# Patient Record
Sex: Male | Born: 1975 | Race: White | Hispanic: No | Smoking: Former smoker
Health system: Southern US, Community
[De-identification: ages and names within clinical notes are randomized; demographics above are authoritative.]

## PROBLEM LIST (undated history)

## (undated) DIAGNOSIS — I10 Essential (primary) hypertension: Secondary | ICD-10-CM

## (undated) DIAGNOSIS — Z8619 Personal history of other infectious and parasitic diseases: Secondary | ICD-10-CM

## (undated) DIAGNOSIS — T7840XA Allergy, unspecified, initial encounter: Secondary | ICD-10-CM

## (undated) DIAGNOSIS — G4733 Obstructive sleep apnea (adult) (pediatric): Secondary | ICD-10-CM

## (undated) DIAGNOSIS — E119 Type 2 diabetes mellitus without complications: Secondary | ICD-10-CM

## (undated) DIAGNOSIS — K429 Umbilical hernia without obstruction or gangrene: Secondary | ICD-10-CM

## (undated) DIAGNOSIS — I252 Old myocardial infarction: Secondary | ICD-10-CM

## (undated) DIAGNOSIS — K219 Gastro-esophageal reflux disease without esophagitis: Secondary | ICD-10-CM

## (undated) DIAGNOSIS — J45909 Unspecified asthma, uncomplicated: Secondary | ICD-10-CM

## (undated) DIAGNOSIS — E291 Testicular hypofunction: Secondary | ICD-10-CM

## (undated) DIAGNOSIS — E785 Hyperlipidemia, unspecified: Secondary | ICD-10-CM

## (undated) HISTORY — DX: Allergy, unspecified, initial encounter: T78.40XA

## (undated) HISTORY — PX: KNEE SURGERY: SHX244

## (undated) HISTORY — DX: Unspecified asthma, uncomplicated: J45.909

## (undated) HISTORY — PX: BARIATRIC SURGERY: SHX1103

## (undated) HISTORY — DX: Testicular hypofunction: E29.1

## (undated) HISTORY — DX: Gastro-esophageal reflux disease without esophagitis: K21.9

## (undated) HISTORY — DX: Old myocardial infarction: I25.2

## (undated) HISTORY — DX: Umbilical hernia without obstruction or gangrene: K42.9

## (undated) HISTORY — DX: Personal history of other infectious and parasitic diseases: Z86.19

## (undated) HISTORY — DX: Type 2 diabetes mellitus without complications: E11.9

## (undated) HISTORY — DX: Hyperlipidemia, unspecified: E78.5

## (undated) HISTORY — DX: Essential (primary) hypertension: I10

## (undated) HISTORY — PX: HIATAL HERNIA REPAIR: SHX195

## (undated) HISTORY — DX: Obstructive sleep apnea (adult) (pediatric): G47.33

---

## 2012-05-21 DIAGNOSIS — I219 Acute myocardial infarction, unspecified: Secondary | ICD-10-CM

## 2012-05-21 HISTORY — DX: Acute myocardial infarction, unspecified: I21.9

## 2013-05-21 HISTORY — PX: KNEE SURGERY: SHX244

## 2013-07-13 HISTORY — PX: BARIATRIC SURGERY: SHX1103

## 2018-06-24 DIAGNOSIS — G4733 Obstructive sleep apnea (adult) (pediatric): Secondary | ICD-10-CM | POA: Insufficient documentation

## 2018-06-25 DIAGNOSIS — I252 Old myocardial infarction: Secondary | ICD-10-CM | POA: Insufficient documentation

## 2019-03-22 HISTORY — PX: NASAL SEPTUM SURGERY: SHX37

## 2019-12-20 DIAGNOSIS — Z419 Encounter for procedure for purposes other than remedying health state, unspecified: Secondary | ICD-10-CM | POA: Diagnosis not present

## 2020-01-20 DIAGNOSIS — Z419 Encounter for procedure for purposes other than remedying health state, unspecified: Secondary | ICD-10-CM | POA: Diagnosis not present

## 2020-02-05 ENCOUNTER — Encounter: Payer: Self-pay | Admitting: Family Medicine

## 2020-02-05 ENCOUNTER — Ambulatory Visit (INDEPENDENT_AMBULATORY_CARE_PROVIDER_SITE_OTHER): Payer: Medicaid Other | Admitting: Family Medicine

## 2020-02-05 ENCOUNTER — Other Ambulatory Visit: Payer: Self-pay

## 2020-02-05 VITALS — BP 124/71 | HR 69 | Temp 97.9°F | Ht 71.0 in | Wt 301.8 lb

## 2020-02-05 DIAGNOSIS — R197 Diarrhea, unspecified: Secondary | ICD-10-CM | POA: Diagnosis not present

## 2020-02-05 DIAGNOSIS — E1165 Type 2 diabetes mellitus with hyperglycemia: Secondary | ICD-10-CM | POA: Diagnosis not present

## 2020-02-05 DIAGNOSIS — R600 Localized edema: Secondary | ICD-10-CM

## 2020-02-05 DIAGNOSIS — E785 Hyperlipidemia, unspecified: Secondary | ICD-10-CM

## 2020-02-05 NOTE — Progress Notes (Signed)
New Patient Office Visit  Assessment & Plan:  1. Bilateral lower extremity edema - Education provided on edema. Encouraged patient to decrease salt intake and elevate feet when able.  - CBC with Differential/Platelet; Future - CMP14+EGFR; Future - Thyroid Panel With TSH; Future - Brain natriuretic peptide; Future  2. Diarrhea, unspecified type - Cdiff NAA+O+P+Stool Culture - Tissue Transglutaminase Abs,IgG,IgA; Future  3. Hyperlipidemia, unspecified hyperlipidemia type - CMP14+EGFR; Future - Lipid panel; Future  4. Type 2 diabetes mellitus with hyperglycemia, without long-term current use of insulin (HCC) - CMP14+EGFR; Future - Microalbumin / creatinine urine ratio; Future - Bayer DCA Hb A1c Waived; Future  Patient will return next week for fasting labs.   Follow-up: Return for as directed after labs result.   Hendricks Limes, MSN, APRN, FNP-C [facility]  Subjective:  Patient ID: Jimmy Tyler, male    DOB: 30-Sep-1975  Age: 44 y.o. MRN: [id number]  Patient Care Team: Loman Brooklyn, FNP as PCP - General (Family Medicine)  CC:  Chief Complaint  Patient presents with  . New Patient (Initial Visit)    Patient is from [place]   . Establish Care  . Edema    Patient states that his lower legs have been swelling.  . Diarrhea    Patient states this has been ongoing and has gotten worse the last few years.  States he never got answers as to what was going on.    HPI Jimmy Tyler presents to establish care.   Patient reports bilateral lower extremity edema that is always worse at the end of the day. This has been going on for years. He reports he just wants to make sure nothing is wrong as his son rode a toy into his shin a month ago and he noticed it took a long time for the indention to go away. Denies shortness of breath.   Patient also reports he has had diarrhea for 20-25 years but has never had an answer as to why. He reports it has  been worse for the past 6-8 months. He has been back on metformin for about the same amount of time. He is not sure if his prescription is regular or XR. He reports the diarrhea was going on before he ever started on metformin, but he has been on and off it for many years due to his weight. He was able to come off of it after bariatric surgery, but recently had to start it back. He has tried increasing his fiber intake, but that did not make a difference. He is currently taking Imodium-AD 2-3 times/day. He has 2-3 loose stools per day on a normal day and 10 on a bad day. Denies any abdominal pain. He reports he was diagnosed with c.diff a few years ago that was never treated to his knowledge. He has never had a colonoscopy.   Patient reports he has been on Zetia for hyperlipidemia since October or November of 2020. He is intolerant of statins due to body aches.    Review of Systems  Constitutional: Negative for chills, fever, malaise/fatigue and weight loss.  HENT: Negative for congestion, ear discharge, ear pain, nosebleeds, sinus pain, sore throat and tinnitus.   Eyes: Negative for blurred vision, double vision, pain, discharge and redness.  Respiratory: Negative for cough, shortness of breath and wheezing.   Cardiovascular: Positive for leg swelling. Negative for chest pain and palpitations.  Gastrointestinal: Positive for diarrhea. Negative for abdominal pain, constipation, heartburn, nausea  and vomiting.  Genitourinary: Negative for dysuria, frequency and urgency.  Musculoskeletal: Negative for myalgias.  Skin: Negative for rash.  Neurological: Negative for dizziness, seizures, weakness and headaches.  Psychiatric/Behavioral: Negative for depression, substance abuse and suicidal ideas. The patient is not nervous/anxious.    No current outpatient medications on file.  No Known Allergies  Past Medical History:  Diagnosis Date  . Allergy   . Asthma   . Diabetes mellitus without  complication ([place])   . Hyperlipidemia   . Hypertension     Past Surgical History:  Procedure Laterality Date  . BARIATRIC SURGERY    . KNEE SURGERY      Family History  Problem Relation Age of Onset  . Autism Son   . Skin cancer Maternal Grandmother     Social History   Socioeconomic History  . Marital status: Married    Spouse name: Not on file  . Number of children: Not on file  . Years of education: Not on file  . Highest education level: Not on file  Occupational History  . Not on file  Tobacco Use  . Smoking status: Former Smoker    Packs/day: 1.00    Types: Cigarettes    Quit date: 02/04/2014    Years since quitting: 6.0  . Smokeless tobacco: Never Used  Vaping Use  . Vaping Use: Never used  Substance and Sexual Activity  . Alcohol use: Yes    Comment: occ  . Drug use: Never  . Sexual activity: Yes    Birth control/protection: None  Other Topics Concern  . Not on file  Social History Narrative  . Not on file   Social Determinants of Health   Financial Resource Strain:   . Difficulty of Paying Living Expenses: Not on file  Food Insecurity:   . Worried About Charity fundraiser in the Last Year: Not on file  . Ran Out of Food in the Last Year: Not on file  Transportation Needs:   . Lack of Transportation (Medical): Not on file  . Lack of Transportation (Non-Medical): Not on file  Physical Activity:   . Days of Exercise per Week: Not on file  . Minutes of Exercise per Session: Not on file  Stress:   . Feeling of Stress : Not on file  Social Connections:   . Frequency of Communication with Friends and Family: Not on file  . Frequency of Social Gatherings with Friends and Family: Not on file  . Attends Religious Services: Not on file  . Active Member of Clubs or Organizations: Not on file  . Attends Archivist Meetings: Not on file  . Marital Status: Not on file  Intimate Partner Violence:   . Fear of Current or Ex-Partner: Not on file    . Emotionally Abused: Not on file  . Physically Abused: Not on file  . Sexually Abused: Not on file    Objective:   Today's Vitals: BP 124/71   Pulse 69   Temp 97.9 F (36.6 C) (Temporal)   Ht _0  (1.803 m)   Wt (!) 301 lb 12.8 oz (136.9 kg)   SpO2 96%   BMI 42.09 kg/m   Physical Exam Vitals reviewed.  Constitutional:      General: He is not in acute distress.    Appearance: Normal appearance. He is morbidly obese. He is not ill-appearing, toxic-appearing or diaphoretic.  HENT:     Head: Normocephalic and atraumatic.  Eyes:  General: No scleral icterus.       Right eye: No discharge.        Left eye: No discharge.     Conjunctiva/sclera: Conjunctivae normal.  Cardiovascular:     Rate and Rhythm: Normal rate and regular rhythm.     Heart sounds: Normal heart sounds. No murmur heard.  No friction rub. No gallop.   Pulmonary:     Effort: Pulmonary effort is normal. No respiratory distress.     Breath sounds: Normal breath sounds. No stridor. No wheezing, rhonchi or rales.  Musculoskeletal:        General: Normal range of motion.     Cervical back: Normal range of motion.     Right lower leg: Edema (2+) present.     Left lower leg: Edema (2+) present.  Skin:    General: Skin is warm and dry.  Neurological:     Mental Status: He is alert and oriented to person, place, and time. Mental status is at baseline.  Psychiatric:        Mood and Affect: Mood normal.        Behavior: Behavior normal.        Thought Content: Thought content normal.        Judgment: Judgment normal.

## 2020-02-05 NOTE — Patient Instructions (Signed)
Please call back ASAP with your medication dosages so we can put it in your chart.   Edema  Edema is when you have too much fluid in your body or under your skin. Edema may make your legs, feet, and ankles swell up. Swelling is also common in looser tissues, like around your eyes. This is a common condition. It gets more common as you get older. There are many possible causes of edema. Eating too much salt (sodium) and being on your feet or sitting for a long time can cause edema in your legs, feet, and ankles. Hot weather may make edema worse. Edema is usually painless. Your skin may look swollen or shiny. Follow these instructions at home:  Keep the swollen body part raised (elevated) above the level of your heart when you are sitting or lying down.  Do not sit still or stand for a long time.  Do not wear tight clothes. Do not wear garters on your upper legs.  Exercise your legs. This can help the swelling go down.  Wear elastic bandages or support stockings as told by your doctor.  Eat a low-salt (low-sodium) diet to reduce fluid as told by your doctor.  Depending on the cause of your swelling, you may need to limit how much fluid you drink (fluid restriction).  Take over-the-counter and prescription medicines only as told by your doctor. Contact a doctor if:  Treatment is not working.  You have heart, liver, or kidney disease and have symptoms of edema.  You have sudden and unexplained weight gain. Get help right away if:  You have shortness of breath or chest pain.  You cannot breathe when you lie down.  You have pain, redness, or warmth in the swollen areas.  You have heart, liver, or kidney disease and get edema all of a sudden.  You have a fever and your symptoms get worse all of a sudden. Summary  Edema is when you have too much fluid in your body or under your skin.  Edema may make your legs, feet, and ankles swell up. Swelling is also common in looser tissues,  like around your eyes.  Raise (elevate) the swollen body part above the level of your heart when you are sitting or lying down.  Follow your doctor's instructions about diet and how much fluid you can drink (fluid restriction). This information is not intended to replace advice given to you by your health care provider. Make sure you discuss any questions you have with your health care provider. Document Revised: 05/10/2017 Document Reviewed: 05/25/2016 Elsevier Patient Education  2020 [employer].

## 2020-02-08 ENCOUNTER — Other Ambulatory Visit: Payer: Self-pay

## 2020-02-08 ENCOUNTER — Other Ambulatory Visit: Payer: Medicaid Other

## 2020-02-08 ENCOUNTER — Telehealth: Payer: Self-pay

## 2020-02-08 DIAGNOSIS — E1165 Type 2 diabetes mellitus with hyperglycemia: Secondary | ICD-10-CM

## 2020-02-08 DIAGNOSIS — R197 Diarrhea, unspecified: Secondary | ICD-10-CM | POA: Diagnosis not present

## 2020-02-08 DIAGNOSIS — E785 Hyperlipidemia, unspecified: Secondary | ICD-10-CM | POA: Diagnosis not present

## 2020-02-08 DIAGNOSIS — R600 Localized edema: Secondary | ICD-10-CM | POA: Diagnosis not present

## 2020-02-08 LAB — BAYER DCA HB A1C WAIVED: HB A1C (BAYER DCA - WAIVED): 7.4 % — ABNORMAL HIGH (ref <7.0)

## 2020-02-08 NOTE — Telephone Encounter (Signed)
Medication have been updated in chart.

## 2020-02-08 NOTE — Telephone Encounter (Signed)
-----   Message from Loman Brooklyn, [place] sent at 02/07/2020  7:55 PM EDT ----- Please call patient to update medication list - he never called back.

## 2020-02-09 LAB — CMP14+EGFR
ALT: 24 IU/L (ref 0–44)
AST: 20 IU/L (ref 0–40)
Albumin/Globulin Ratio: 1.4 (ref 1.2–2.2)
Albumin: 4.1 g/dL (ref 4.0–5.0)
Alkaline Phosphatase: 81 IU/L (ref 44–121)
BUN/Creatinine Ratio: 18 (ref 9–20)
BUN: 16 mg/dL (ref 6–24)
Bilirubin Total: 0.5 mg/dL (ref 0.0–1.2)
CO2: 23 mmol/L (ref 20–29)
Calcium: 9 mg/dL (ref 8.7–10.2)
Chloride: 101 mmol/L (ref 96–106)
Creatinine, Ser: 0.87 mg/dL (ref 0.76–1.27)
GFR calc Af Amer: 120 mL/min/{1.73_m2} (ref >59)
GFR calc non Af Amer: 103 mL/min/{1.73_m2} (ref >59)
Globulin, Total: 3 g/dL (ref 1.5–4.5)
Glucose: 151 mg/dL — ABNORMAL HIGH (ref 65–99)
Potassium: 4.1 mmol/L (ref 3.5–5.2)
Sodium: 138 mmol/L (ref 134–144)
Total Protein: 7.1 g/dL (ref 6.0–8.5)

## 2020-02-09 LAB — CBC WITH DIFFERENTIAL/PLATELET
Basophils Absolute: 0 10*3/uL (ref 0.0–0.2)
Basos: 0 %
EOS (ABSOLUTE): 0.1 10*3/uL (ref 0.0–0.4)
Eos: 3 %
Hematocrit: 40.2 % (ref 37.5–51.0)
Hemoglobin: 13.6 g/dL (ref 13.0–17.7)
Immature Grans (Abs): 0 10*3/uL (ref 0.0–0.1)
Immature Granulocytes: 0 %
Lymphocytes Absolute: 1.5 10*3/uL (ref 0.7–3.1)
Lymphs: 33 %
MCH: 30.8 pg (ref 26.6–33.0)
MCHC: 33.8 g/dL (ref 31.5–35.7)
MCV: 91 fL (ref 79–97)
Monocytes Absolute: 0.3 10*3/uL (ref 0.1–0.9)
Monocytes: 6 %
Neutrophils Absolute: 2.6 10*3/uL (ref 1.4–7.0)
Neutrophils: 58 %
Platelets: 192 10*3/uL (ref 150–450)
RBC: 4.41 x10E6/uL (ref 4.14–5.80)
RDW: 12.7 % (ref 11.6–15.4)
WBC: 4.5 10*3/uL (ref 3.4–10.8)

## 2020-02-09 LAB — MICROALBUMIN / CREATININE URINE RATIO
Creatinine, Urine: 191.2 mg/dL
Microalb/Creat Ratio: 7 mg/g creat (ref 0–29)
Microalbumin, Urine: 12.9 ug/mL

## 2020-02-09 LAB — BRAIN NATRIURETIC PEPTIDE: BNP: 12 pg/mL (ref 0.0–100.0)

## 2020-02-09 LAB — LIPID PANEL
Chol/HDL Ratio: 4.8 ratio (ref 0.0–5.0)
Cholesterol, Total: 203 mg/dL — ABNORMAL HIGH (ref 100–199)
HDL: 42 mg/dL (ref >39)
LDL Chol Calc (NIH): 116 mg/dL — ABNORMAL HIGH (ref 0–99)
Triglycerides: 256 mg/dL — ABNORMAL HIGH (ref 0–149)
VLDL Cholesterol Cal: 45 mg/dL — ABNORMAL HIGH (ref 5–40)

## 2020-02-09 LAB — THYROID PANEL WITH TSH
Free Thyroxine Index: 1.7 (ref 1.2–4.9)
T3 Uptake Ratio: 28 % (ref 24–39)
T4, Total: 6.1 ug/dL (ref 4.5–12.0)
TSH: 1.85 u[IU]/mL (ref 0.450–4.500)

## 2020-02-09 LAB — TISSUE TRANSGLUTAMINASE ABS,IGG,IGA
Tissue Transglut Ab: 5 U/mL (ref 0–5)
Transglutaminase IgA: 9 U/mL — ABNORMAL HIGH (ref 0–3)

## 2020-02-10 ENCOUNTER — Other Ambulatory Visit: Payer: Self-pay | Admitting: Family Medicine

## 2020-02-10 DIAGNOSIS — E785 Hyperlipidemia, unspecified: Secondary | ICD-10-CM

## 2020-02-10 DIAGNOSIS — E1165 Type 2 diabetes mellitus with hyperglycemia: Secondary | ICD-10-CM

## 2020-02-10 DIAGNOSIS — R197 Diarrhea, unspecified: Secondary | ICD-10-CM

## 2020-02-10 MED ORDER — NEXLIZET 180-10 MG PO TABS: 1.0000 | ORAL_TABLET | Freq: Every day | ORAL | 2 refills | Status: DC

## 2020-02-10 MED ORDER — METFORMIN HCL ER 500 MG PO TB24: ORAL_TABLET | ORAL | 1 refills | Status: DC

## 2020-02-11 ENCOUNTER — Encounter: Payer: Self-pay | Admitting: Internal Medicine

## 2020-02-11 ENCOUNTER — Telehealth: Payer: Self-pay | Admitting: Family Medicine

## 2020-02-11 NOTE — Telephone Encounter (Signed)
Pt came in to ask for updated medication list--handed it to him and it did not look right. Pt asked to talk to nurse. Please call back

## 2020-02-12 LAB — CDIFF NAA+O+P+STOOL CULTURE
E coli, Shiga toxin Assay: NEGATIVE
Toxigenic C. Difficile by PCR: NEGATIVE

## 2020-02-17 NOTE — Telephone Encounter (Signed)
No return call 

## 2020-02-19 ENCOUNTER — Telehealth: Payer: Self-pay | Admitting: Family Medicine

## 2020-02-19 DIAGNOSIS — Z419 Encounter for procedure for purposes other than remedying health state, unspecified: Secondary | ICD-10-CM | POA: Diagnosis not present

## 2020-02-19 NOTE — Telephone Encounter (Signed)
Almyra Free - is this one we can get covered?

## 2020-02-19 NOTE — Telephone Encounter (Signed)
REFERRAL REQUEST Telephone Note  Have you been seen at our office for this problem? No (Advise that they may need an appointment with their PCP before a referral can be done)  Reason for Referral: Patient did not state Referral discussed with patient: YES Best contact number of patient for referral team:    Has patient been seen by a specialist for this issue before: ? Patient provider preference for referral: Forestine Na Patient location preference for referral: [place]    Patient notified that referrals can take up to a week or longer to process. If they haven't heard anything within a week they should call back and speak with the referral department.

## 2020-02-19 NOTE — Telephone Encounter (Signed)
Pt cannot afford Bempedoic Acid-Ezetimibe (NEXLIZET) 180-10 MG TABS its $400. Can it be changed back to what he was taking? Use [facility] pharmacy Please call back

## 2020-02-19 NOTE — Telephone Encounter (Signed)
Medication approved; patient notified

## 2020-02-19 NOTE — Telephone Encounter (Signed)
Prior authorization submitted q

## 2020-02-28 ENCOUNTER — Encounter: Payer: Self-pay | Admitting: Family Medicine

## 2020-03-17 ENCOUNTER — Ambulatory Visit: Payer: Medicaid Other | Admitting: Internal Medicine

## 2020-03-17 ENCOUNTER — Encounter: Payer: Self-pay | Admitting: Internal Medicine

## 2020-03-21 DIAGNOSIS — Z419 Encounter for procedure for purposes other than remedying health state, unspecified: Secondary | ICD-10-CM | POA: Diagnosis not present

## 2020-04-19 ENCOUNTER — Telehealth: Payer: Self-pay

## 2020-04-19 ENCOUNTER — Other Ambulatory Visit: Payer: Self-pay

## 2020-04-19 MED ORDER — LISINOPRIL 10 MG PO TABS
10.0000 mg | ORAL_TABLET | Freq: Every day | ORAL | 0 refills | Status: DC
Start: 2020-04-19 — End: 2020-07-25

## 2020-04-19 MED ORDER — METOPROLOL TARTRATE 25 MG PO TABS
25.0000 mg | ORAL_TABLET | Freq: Two times a day (BID) | ORAL | 0 refills | Status: DC
Start: 2020-04-19 — End: 2020-12-02

## 2020-04-19 NOTE — Telephone Encounter (Signed)
  Prescription Request  04/19/2020  What is the name of the medication or equipment? Lisinopril 10 mg, Metformin 500 mg, Metoprolol 25 mg Seen Britney 9-17 and these RX were not called in   Have you contacted your pharmacy to request a refill? (if applicable) NO  Which pharmacy would you like this sent to? [facility]   Patient notified that their request is being sent to the clinical staff for review and that they should receive a response within 2 business days.

## 2020-04-19 NOTE — Telephone Encounter (Signed)
Patient needs to be seen for the referral

## 2020-04-20 DIAGNOSIS — Z419 Encounter for procedure for purposes other than remedying health state, unspecified: Secondary | ICD-10-CM | POA: Diagnosis not present

## 2020-04-21 ENCOUNTER — Encounter: Payer: Self-pay | Admitting: Internal Medicine

## 2020-04-25 ENCOUNTER — Ambulatory Visit: Payer: Medicaid Other | Admitting: Internal Medicine

## 2020-04-26 ENCOUNTER — Other Ambulatory Visit: Payer: Self-pay

## 2020-04-26 ENCOUNTER — Ambulatory Visit (INDEPENDENT_AMBULATORY_CARE_PROVIDER_SITE_OTHER): Payer: Medicaid Other | Admitting: Nurse Practitioner

## 2020-04-26 ENCOUNTER — Encounter: Payer: Self-pay | Admitting: Nurse Practitioner

## 2020-04-26 VITALS — BP 111/63 | HR 54 | Temp 98.5°F | Ht 71.0 in | Wt 305.0 lb

## 2020-04-26 DIAGNOSIS — G4730 Sleep apnea, unspecified: Secondary | ICD-10-CM | POA: Insufficient documentation

## 2020-04-26 NOTE — Progress Notes (Signed)
Established Patient Office Visit  Subjective:  Patient ID: Jimmy Tyler, male    DOB: 20-Aug-1975  Age: 44 y.o. MRN: [id number]  CC: No chief complaint on file.   HPI Jimmy Tyler presents for follow-up sleep apnea.  Patient has has sleep apnea for over 11 years.  Patient currently using CPAP.  He recently moved from [place] to [place] and seeking referral to sleep study, and new CPAP equipment.  Past Medical History:  Diagnosis Date  . Allergy   . Asthma   . Diabetes mellitus without complication ([place])   . GERD (gastroesophageal reflux disease)   . History of Clostridium difficile infection   . History of non-ST elevation myocardial infarction (NSTEMI)   . Hyperlipidemia   . Hypertension   . OSA on CPAP   . Testicular hypofunction   . Umbilical hernia     Past Surgical History:  Procedure Laterality Date  . BARIATRIC SURGERY  07/13/2013  . HIATAL HERNIA REPAIR    . KNEE SURGERY Left 2015   Due to meniscal tear    Family History  Problem Relation Age of Onset  . Autism Son   . Skin cancer Maternal Grandmother   . Heart attack Maternal Grandfather     Social History   Socioeconomic History  . Marital status: Married    Spouse name: Not on file  . Number of children: Not on file  . Years of education: Not on file  . Highest education level: Not on file  Occupational History  . Not on file  Tobacco Use  . Smoking status: Former Smoker    Packs/day: 1.00    Types: Cigarettes    Quit date: 02/04/2014    Years since quitting: 6.2  . Smokeless tobacco: Never Used  Vaping Use  . Vaping Use: Never used  Substance and Sexual Activity  . Alcohol use: Yes    Comment: occ  . Drug use: Never  . Sexual activity: Yes    Birth control/protection: None  Other Topics Concern  . Not on file  Social History Narrative  . Not on file   Social Determinants of Health   Financial Resource Strain:   . Difficulty of Paying Living Expenses: Not on file   Food Insecurity:   . Worried About Charity fundraiser in the Last Year: Not on file  . Ran Out of Food in the Last Year: Not on file  Transportation Needs:   . Lack of Transportation (Medical): Not on file  . Lack of Transportation (Non-Medical): Not on file  Physical Activity:   . Days of Exercise per Week: Not on file  . Minutes of Exercise per Session: Not on file  Stress:   . Feeling of Stress : Not on file  Social Connections:   . Frequency of Communication with Friends and Family: Not on file  . Frequency of Social Gatherings with Friends and Family: Not on file  . Attends Religious Services: Not on file  . Active Member of Clubs or Organizations: Not on file  . Attends Archivist Meetings: Not on file  . Marital Status: Not on file  Intimate Partner Violence:   . Fear of Current or Ex-Partner: Not on file  . Emotionally Abused: Not on file  . Physically Abused: Not on file  . Sexually Abused: Not on file    Outpatient Medications Prior to Visit  Medication Sig Dispense Refill  . Bempedoic Acid-Ezetimibe (NEXLIZET) 180-10 MG TABS Take  1 tablet by mouth daily. 30 tablet 2  . lisinopril (ZESTRIL) 10 MG tablet Take 1 tablet (10 mg total) by mouth daily. 90 tablet 0  . metFORMIN (GLUCOPHAGE-XR) 500 MG 24 hr tablet Take 2 tablets (1,000 mg total) by mouth daily with breakfast AND 1 tablet (500 mg total) daily with supper. 270 tablet 1  . metoprolol tartrate (LOPRESSOR) 25 MG tablet Take 1 tablet (25 mg total) by mouth 2 (two) times daily. 180 tablet 0  . omeprazole (PRILOSEC) 20 MG capsule Take 20 mg by mouth daily.     No facility-administered medications prior to visit.    Allergies  Allergen Reactions  . Statins Other (See Comments)    Body aches.  Failed 3 different ones.    ROS Review of Systems  Respiratory: Positive for apnea. Negative for cough, chest tightness, shortness of breath and wheezing.   All other systems reviewed and are negative.      Objective:    Physical Exam Vitals reviewed.  Constitutional:      Appearance: Normal appearance. He is well-groomed. He is obese.  HENT:     Head: Normocephalic.     Nose: Nose normal.  Eyes:     Conjunctiva/sclera: Conjunctivae normal.  Cardiovascular:     Rate and Rhythm: Normal rate.     Pulses: Normal pulses.     Heart sounds: Normal heart sounds.  Pulmonary:     Effort: Pulmonary effort is normal. No respiratory distress.     Breath sounds: No wheezing.     Comments: Sleep apnea Chest:     Chest wall: No tenderness.  Abdominal:     General: Bowel sounds are normal.  Musculoskeletal:        General: Normal range of motion.  Skin:    General: Skin is warm.  Neurological:     Mental Status: He is alert and oriented to person, place, and time.  Psychiatric:        Behavior: Behavior is cooperative.     There were no vitals taken for this visit. Wt Readings from Last 3 Encounters:  02/05/20 (!) 301 lb 12.8 oz (136.9 kg)     Health Maintenance Due  Topic Date Due  . Hepatitis C Screening  Never done  . FOOT EXAM  Never done  . OPHTHALMOLOGY EXAM  Never done  . COVID-19 Vaccine (1) Never done  . HIV Screening  Never done  . INFLUENZA VACCINE  Never done    There are no preventive care reminders to display for this patient.  Lab Results  Component Value Date   TSH 1.850 02/08/2020   Lab Results  Component Value Date   WBC 4.5 02/08/2020   HGB 13.6 02/08/2020   HCT 40.2 02/08/2020   MCV 91 02/08/2020   PLT 192 02/08/2020   Lab Results  Component Value Date   NA 138 02/08/2020   K 4.1 02/08/2020   CO2 23 02/08/2020   GLUCOSE 151 (H) 02/08/2020   BUN 16 02/08/2020   CREATININE 0.87 02/08/2020   BILITOT 0.5 02/08/2020   ALKPHOS 81 02/08/2020   AST 20 02/08/2020   ALT 24 02/08/2020   PROT 7.1 02/08/2020   ALBUMIN 4.1 02/08/2020   CALCIUM 9.0 02/08/2020   Lab Results  Component Value Date   CHOL 203 (H) 02/08/2020   Lab Results  Component  Value Date   HDL 42 02/08/2020   Lab Results  Component Value Date   LDLCALC 116 (H) 02/08/2020  Lab Results  Component Value Date   TRIG 256 (H) 02/08/2020   Lab Results  Component Value Date   CHOLHDL 4.8 02/08/2020   Lab Results  Component Value Date   HGBA1C 7.4 (H) 02/08/2020      Assessment & Plan:   Problem List Items Addressed This Visit    None      No orders of the defined types were placed in this encounter.   Follow-up: No follow-ups on file.    Ivy Lynn, NP

## 2020-04-26 NOTE — Assessment & Plan Note (Signed)
Sleep apnea well controlled and managed on current equipment.  Referral completed to [facility] neurology sleep study for reassessment and new equipment.  Patient recently relocated from [place] and has no pulmonary doctor in the area.  Provided education to patient with printed handouts given.  Follow-up with worsening or unresolved symptoms.

## 2020-04-26 NOTE — Patient Instructions (Signed)

## 2020-04-27 ENCOUNTER — Telehealth: Payer: Self-pay

## 2020-04-27 NOTE — Telephone Encounter (Signed)
REFERRAL REQUEST Telephone Note  Have you been seen at our office for this problem? NO but was seen 12-7 with Je for referral for sleep study and when he got home something happened between wife (Advise that they may need an appointment with their PCP before a referral can be done)  Reason for Referral: Marriage counseling Referral discussed with patient: NO Best contact number of patient for referral team:   [phone] Has patient been seen by a specialist for this issue before: NO Patient provider preference for referral: No clue where to go or see Patient location preference for referral: No clue where to go or see   Patient notified that referrals can take up to a week or longer to process. If they haven't heard anything within a week they should call back and speak with the referral department.

## 2020-04-28 ENCOUNTER — Telehealth: Payer: Self-pay | Admitting: Family Medicine

## 2020-04-28 ENCOUNTER — Ambulatory Visit: Payer: Medicaid Other | Admitting: Internal Medicine

## 2020-04-28 NOTE — Telephone Encounter (Signed)
Pt's wife was calling to let us know that both her and the pt tested positive for COVID with an at home test, wants to know when they need to get retested and how long they need to quarantine before going back to work. Pt saw Je on Tuesday 12/7 and wanted [place] to know that they both tested positive since they were just in the office for an appt this week

## 2020-04-28 NOTE — Telephone Encounter (Signed)
Please advise if referral can be placed.

## 2020-04-28 NOTE — Telephone Encounter (Signed)
Aware how long to quarantine. Will send to [facility] for a FYI for dx.

## 2020-04-29 NOTE — Telephone Encounter (Signed)
10 to 14 days ideal time for quarantine before going back to work.

## 2020-04-29 NOTE — Telephone Encounter (Signed)
Please call patient with office numbers for counseling.  Am not sure we do marriage counseling referral but there are tons of counseling specialist online and in office numbers that can be found online.  I personally do not have a preference. Referral to sleep study already completed.   Thank you

## 2020-04-29 NOTE — Telephone Encounter (Signed)
Jimmy Sousa do you happen to know any counselors that accept Medicaid specifically for marriage counseling?

## 2020-05-01 ENCOUNTER — Other Ambulatory Visit: Payer: Self-pay | Admitting: Physician Assistant

## 2020-05-01 DIAGNOSIS — U071 COVID-19: Secondary | ICD-10-CM

## 2020-05-01 DIAGNOSIS — I2510 Atherosclerotic heart disease of native coronary artery without angina pectoris: Secondary | ICD-10-CM

## 2020-05-01 DIAGNOSIS — J45909 Unspecified asthma, uncomplicated: Secondary | ICD-10-CM

## 2020-05-01 DIAGNOSIS — G4730 Sleep apnea, unspecified: Secondary | ICD-10-CM

## 2020-05-01 NOTE — Progress Notes (Signed)
I connected by phone with Jimmy Tyler on 05/01/2020 at 8:51 AM to discuss the potential use of a new treatment for mild to moderate COVID-19 viral infection in non-hospitalized patients.  This patient is a 44 y.o. male that meets the FDA criteria for Emergency Use Authorization of COVID monoclonal antibody sotrovimab, casirivimab/imdevimab or bamlamivimab/estevimab.  Has a (+) direct SARS-CoV-2 viral test result  Has mild or moderate COVID-19   Is NOT hospitalized due to COVID-19  Is within 10 days of symptom onset  Has at least one of the high risk factor(s) for progression to severe COVID-19 and/or hospitalization as defined in EUA.  Specific high risk criteria : BMI > 25, Cardiovascular disease or hypertension, Chronic Lung Disease and Other high risk medical condition per CDC:  high SVI   I have spoken and communicated the following to the patient or parent/caregiver regarding COVID monoclonal antibody treatment:  1. FDA has authorized the emergency use for the treatment of mild to moderate COVID-19 in adults and pediatric patients with positive results of direct SARS-CoV-2 viral testing who are 58 years of age and older weighing at least 40 kg, and who are at high risk for progressing to severe COVID-19 and/or hospitalization.  2. The significant known and potential risks and benefits of COVID monoclonal antibody, and the extent to which such potential risks and benefits are unknown.  3. Information on available alternative treatments and the risks and benefits of those alternatives, including clinical trials.  4. Patients treated with COVID monoclonal antibody should continue to self-isolate and use infection control measures (e.g., wear mask, isolate, social distance, avoid sharing personal items, clean and disinfect "high touch" surfaces, and frequent handwashing) according to CDC guidelines.   5. The patient or parent/caregiver has the option to accept or refuse COVID  monoclonal antibody treatment.  After reviewing this information with the patient, the patient has agreed to receive one of the available covid 19 monoclonal antibodies and will be provided an appropriate fact sheet prior to infusion.  Sx onset 12/8. Set up for infusion on 12/14 @ 10:30am. Directions given to [facility]. Pt is aware that insurance will be charged an infusion fee. Pt is unvaccinated. Tested at [facility], will bring in copy of + test.   Jimmy Tyler 05/01/2020 8:51 AM

## 2020-05-03 ENCOUNTER — Ambulatory Visit (HOSPITAL_COMMUNITY)
Admission: RE | Admit: 2020-05-03 | Discharge: 2020-05-03 | Disposition: A | Discharge status: Home / Self Care | Payer: Medicaid Other | Source: Ambulatory Visit | Attending: Pulmonary Disease | Admitting: Pulmonary Disease

## 2020-05-03 DIAGNOSIS — G4730 Sleep apnea, unspecified: Secondary | ICD-10-CM | POA: Diagnosis not present

## 2020-05-03 DIAGNOSIS — I2510 Atherosclerotic heart disease of native coronary artery without angina pectoris: Secondary | ICD-10-CM

## 2020-05-03 DIAGNOSIS — U071 COVID-19: Secondary | ICD-10-CM | POA: Insufficient documentation

## 2020-05-03 DIAGNOSIS — J45909 Unspecified asthma, uncomplicated: Secondary | ICD-10-CM | POA: Insufficient documentation

## 2020-05-03 MED ORDER — DIPHENHYDRAMINE HCL 50 MG/ML IJ SOLN: 50.0000 mg | Freq: Once | INTRAMUSCULAR | Status: DC | PRN

## 2020-05-03 MED ORDER — SODIUM CHLORIDE 0.9 % IV SOLN: Freq: Once | INTRAVENOUS | Status: AC

## 2020-05-03 MED ORDER — ALBUTEROL SULFATE HFA 108 (90 BASE) MCG/ACT IN AERS: 2.0000 | INHALATION_SPRAY | Freq: Once | RESPIRATORY_TRACT | Status: DC | PRN

## 2020-05-03 MED ORDER — FAMOTIDINE IN NACL 20-0.9 MG/50ML-% IV SOLN: 20.0000 mg | Freq: Once | INTRAVENOUS | Status: DC | PRN

## 2020-05-03 MED ORDER — SODIUM CHLORIDE 0.9 % IV SOLN: INTRAVENOUS | Status: DC | PRN

## 2020-05-03 MED ORDER — METHYLPREDNISOLONE SODIUM SUCC 125 MG IJ SOLR: 125.0000 mg | Freq: Once | INTRAMUSCULAR | Status: DC | PRN

## 2020-05-03 MED ORDER — EPINEPHRINE 0.3 MG/0.3ML IJ SOAJ: 0.3000 mg | Freq: Once | INTRAMUSCULAR | Status: DC | PRN

## 2020-05-03 NOTE — Progress Notes (Signed)
Patient reviewed Fact Sheet for Patients, Parents, and Caregivers for Emergency Use Authorization (EUA) of Sotrovimab for the Treatment of Coronavirus. Patient also reviewed and is agreeable to the estimated cost of treatment. Patient is agreeable to proceed.   

## 2020-05-03 NOTE — Discharge Instructions (Signed)
10 Things You Can Do to Manage Your COVID-19 Symptoms at Home If you have possible or confirmed COVID-19: 1. Stay home from work and school. And stay away from other public places. If you must go out, avoid using any kind of public transportation, ridesharing, or taxis. 2. Monitor your symptoms carefully. If your symptoms get worse, call your healthcare provider immediately. 3. Get rest and stay hydrated. 4. If you have a medical appointment, call the healthcare provider ahead of time and tell them that you have or may have COVID-19. 5. For medical emergencies, call 911 and notify the dispatch personnel that you have or may have COVID-19. 6. Cover your cough and sneezes with a tissue or use the inside of your elbow. 7. Wash your hands often with soap and water for at least 20 seconds or clean your hands with an alcohol-based hand sanitizer that contains at least 60% alcohol. 8. As much as possible, stay in a specific room and away from other people in your home. Also, you should use a separate bathroom, if available. If you need to be around other people in or outside of the home, wear a mask. 9. Avoid sharing personal items with other people in your household, like dishes, towels, and bedding. 10. Clean all surfaces that are touched often, like counters, tabletops, and doorknobs. Use household cleaning sprays or wipes according to the label instructions. cdc.gov/coronavirus 11/19/2018 This information is not intended to replace advice given to you by your health care provider. Make sure you discuss any questions you have with your health care provider. Document Revised: 04/23/2019 Document Reviewed: 04/23/2019 Elsevier Patient Education  2020 Elsevier Inc. What types of side effects do monoclonal antibody drugs cause?  Common side effects  In general, the more common side effects caused by monoclonal antibody drugs include: . Allergic reactions, such as hives or itching . Flu-like signs and  symptoms, including chills, fatigue, fever, and muscle aches and pains . Nausea, vomiting . Diarrhea . Skin rashes . Low blood pressure   The CDC is recommending patients who receive monoclonal antibody treatments wait at least 90 days before being vaccinated.  Currently, there are no data on the safety and efficacy of mRNA COVID-19 vaccines in persons who received monoclonal antibodies or convalescent plasma as part of COVID-19 treatment. Based on the estimated half-life of such therapies as well as evidence suggesting that reinfection is uncommon in the 90 days after initial infection, vaccination should be deferred for at least 90 days, as a precautionary measure until additional information becomes available, to avoid interference of the antibody treatment with vaccine-induced immune responses. If you have any questions or concerns after the infusion please call the Advanced Practice Provider on call at [phone]. This number is ONLY intended for your use regarding questions or concerns about the infusion post-treatment side-effects.  Please do not provide this number to others for use. For return to work notes please contact your primary care provider.   If someone you know is interested in receiving treatment please have them call the COVID hotline at [phone].   

## 2020-05-03 NOTE — Progress Notes (Signed)
  Diagnosis: COVID-19  Physician: Dr. Joya Gaskins  Procedure: [facility] Med: bamlanivimab\etesevimab infusion - Provided patient with bamlanimivab\etesevimab fact sheet for patients, parents and caregivers prior to infusion.  Complications: No immediate complications noted.  Discharge: Discharged home   Terence Lux 05/03/2020

## 2020-05-12 DIAGNOSIS — J0100 Acute maxillary sinusitis, unspecified: Secondary | ICD-10-CM | POA: Diagnosis not present

## 2020-05-12 DIAGNOSIS — R059 Cough, unspecified: Secondary | ICD-10-CM | POA: Diagnosis not present

## 2020-05-21 DIAGNOSIS — Z419 Encounter for procedure for purposes other than remedying health state, unspecified: Secondary | ICD-10-CM | POA: Diagnosis not present

## 2020-06-02 ENCOUNTER — Telehealth: Payer: Self-pay | Admitting: Neurology

## 2020-06-02 NOTE — Telephone Encounter (Signed)
FYI: Notified Pt of bad weather on Sunday. Ask patient to call the office on Monday before leaving your home to make sure the office is open. Pt confirmed understood

## 2020-06-06 ENCOUNTER — Institutional Professional Consult (permissible substitution): Payer: Medicaid Other | Admitting: Neurology

## 2020-06-16 ENCOUNTER — Telehealth: Payer: Self-pay | Admitting: *Deleted

## 2020-06-16 ENCOUNTER — Ambulatory Visit (INDEPENDENT_AMBULATORY_CARE_PROVIDER_SITE_OTHER): Payer: Medicaid Other | Admitting: Internal Medicine

## 2020-06-16 ENCOUNTER — Other Ambulatory Visit: Payer: Self-pay

## 2020-06-16 ENCOUNTER — Encounter: Payer: Self-pay | Admitting: *Deleted

## 2020-06-16 ENCOUNTER — Encounter: Payer: Self-pay | Admitting: Internal Medicine

## 2020-06-16 VITALS — BP 132/68 | HR 78 | Temp 97.3°F | Ht 71.0 in | Wt 301.8 lb

## 2020-06-16 DIAGNOSIS — R197 Diarrhea, unspecified: Secondary | ICD-10-CM | POA: Diagnosis not present

## 2020-06-16 DIAGNOSIS — R1310 Dysphagia, unspecified: Secondary | ICD-10-CM | POA: Diagnosis not present

## 2020-06-16 DIAGNOSIS — K219 Gastro-esophageal reflux disease without esophagitis: Secondary | ICD-10-CM

## 2020-06-16 NOTE — Patient Instructions (Signed)
We will schedule you EGD to further evaluate your chronic reflux and feeling of food getting stuck.  At the same time we will perform colonoscopy to evaluate your chronic diarrhea.  Further recommendations to follow.  For now continue on omeprazole and Pepcid as you have been taking them.  Lifestyle and home remedies TO MANAGE REFLUX/HEARTBURN    You may eliminate or reduce the frequency of heartburn by making the following lifestyle changes:    Control your weight. Being overweight is a major risk factor for heartburn and GERD. Excess pounds put pressure on your abdomen, pushing up your stomach and causing acid to back up into your esophagus.     Eat smaller meals. 4 TO 6 MEALS A DAY. This reduces pressure on the lower esophageal sphincter, helping to prevent the valve from opening and acid from washing back into your esophagus.      Loosen your belt. Clothes that fit tightly around your waist put pressure on your abdomen and the lower esophageal sphincter.      Eliminate heartburn triggers. Everyone has specific triggers. Common triggers such as fatty or fried foods, spicy food, tomato sauce, carbonated beverages, alcohol, chocolate, mint, garlic, onion, caffeine and nicotine may make heartburn worse.     Avoid stooping or bending. Tying your shoes is OK. Bending over for longer periods to weed your garden isn't, especially soon after eating.     Don't lie down after a meal. Wait at least three to four hours after eating before going to bed, and don't lie down right after eating.   At [facility] we value your feedback. You may receive a survey about your visit today. Please share your experience as we strive to create trusting relationships with our patients to provide genuine, compassionate, quality care.  We appreciate your understanding and patience as we review any laboratory studies, imaging, and other diagnostic tests that are ordered as we care for you. Our  office policy is 5 business days for review of these results, and any emergent or urgent results are addressed in a timely manner for your best interest. If you do not hear from our office in 1 week, please contact us.   We also encourage the use of MyChart, which contains your medical information for your review as well. If you are not enrolled in this feature, an access code is on this after visit summary for your convenience. Thank you for allowing [place] to be involved in your care.  It was great to see you today!  I hope you have a great rest of your winter!!    Elon Alas. Abbey Chatters, D.O. Gastroenterology and Hepatology [facility]

## 2020-06-16 NOTE — Progress Notes (Signed)
Primary Care Physician:  Loman Brooklyn, FNP Primary Gastroenterologist:  Dr. Abbey Chatters  Chief Complaint  Patient presents with  . Diarrhea    Has had issues since in age 45's. Taking imodium daily x few months and this has helped diarrhea. Has about 1 solid stool a day since taking imodium like this    HPI:   Jimmy Tyler is a 45 y.o. male who presents to the clinic today by referral from his PCP [place] for evaluation.  He states he has had GI issues since his 74s.  Notes mainly chronic diarrhea which was self-limiting until approximately 1 to 2 years ago when he was hospitalized with C. difficile colitis.  He was treated for C. difficile and did well but ever since then his diarrhea has progressively gotten worse.  Notes multiple loose bowel movements daily.  He states he is taking up to 4 Imodium's at night which has improved his symptoms vastly.  States as long as he takes 4 Imodium before bed he will have 1-2 normal loose bowel movements a day.  No melena hematochezia no unintentional weight loss.  Celiac panel was negative.  Also has chronic reflux which is improved after recently starting omeprazole 20 mg nightly.  Also takes famotidine as well.  No chronic NSAID use.  Does note occasional dysphagia primarily with solids.  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  Also looks like he had a hiatal hernia repair at the same time.  Past Medical History:  Diagnosis Date  . Allergy   . Asthma   . Diabetes mellitus without complication ([place])   . GERD (gastroesophageal reflux disease)   . History of Clostridium difficile infection   . History of non-ST elevation myocardial infarction (NSTEMI)   . Hyperlipidemia   . Hypertension   . OSA on CPAP   . Testicular hypofunction   . Umbilical hernia     Past Surgical History:  Procedure Laterality Date  . BARIATRIC SURGERY  07/13/2013  . HIATAL HERNIA REPAIR    . KNEE SURGERY Left 2015   Due to meniscal tear    Current  Outpatient Medications  Medication Sig Dispense Refill  . Bempedoic Acid-Ezetimibe (NEXLIZET) 180-10 MG TABS Take 1 tablet by mouth daily. 30 tablet 2  . Famotidine (PEPCID PO) Take 20-40 mg by mouth daily.    [name] lisinopril (ZESTRIL) 10 MG tablet Take 1 tablet (10 mg total) by mouth daily. 90 tablet 0  . loperamide (IMODIUM) 2 MG capsule Take 6-8 mg by mouth at bedtime.    . metFORMIN (GLUCOPHAGE-XR) 500 MG 24 hr tablet Take 2 tablets (1,000 mg total) by mouth daily with breakfast AND 1 tablet (500 mg total) daily with supper. 270 tablet 1  . metoprolol tartrate (LOPRESSOR) 25 MG tablet Take 1 tablet (25 mg total) by mouth 2 (two) times daily. (Patient taking differently: Take 25 mg by mouth 2 (two) times daily. When he remembers) 180 tablet 0  . omeprazole (PRILOSEC) 20 MG capsule Take 20 mg by mouth daily. (Patient not taking: Reported on 06/16/2020)     No current facility-administered medications for this visit.    Allergies as of 06/16/2020 - Review Complete 06/16/2020  Allergen Reaction Noted  . Statins Other (See Comments) 02/07/2020    Family History  Problem Relation Age of Onset  . COPD Mother   . Autism Son   . Skin cancer Maternal Grandmother   . Heart attack Maternal Grandfather  Social History   Socioeconomic History  . Marital status: Married    Spouse name: Not on file  . Number of children: Not on file  . Years of education: Not on file  . Highest education level: Not on file  Occupational History  . Not on file  Tobacco Use  . Smoking status: Former Smoker    Packs/day: 1.00    Types: Cigarettes    Quit date: 02/04/2014    Years since quitting: 6.3  . Smokeless tobacco: Never Used  Vaping Use  . Vaping Use: Never used  Substance and Sexual Activity  . Alcohol use: Yes    Comment: occ  . Drug use: Never  . Sexual activity: Yes    Birth control/protection: None  Other Topics Concern  . Not on file  Social History Narrative  . Not on file    Social Determinants of Health   Financial Resource Strain: Not on file  Food Insecurity: Not on file  Transportation Needs: Not on file  Physical Activity: Not on file  Stress: Not on file  Social Connections: Not on file  Intimate Partner Violence: Not on file    Subjective: Review of Systems  Constitutional: Negative for chills and fever.  HENT: Negative for congestion and hearing loss.   Eyes: Negative for blurred vision and double vision.  Respiratory: Negative for cough and shortness of breath.   Cardiovascular: Negative for chest pain and palpitations.  Gastrointestinal: Negative for abdominal pain, blood in stool, constipation, diarrhea, heartburn, melena and vomiting.  Genitourinary: Negative for dysuria and urgency.  Musculoskeletal: Negative for joint pain and myalgias.  Skin: Negative for itching and rash.  Neurological: Negative for dizziness and headaches.  Psychiatric/Behavioral: Negative for depression. The patient is not nervous/anxious.        Objective: BP 132/68   Pulse 78   Temp (!) 97.3 F (36.3 C)   Ht 5\' 11"  (1.803 m)   Wt (!) 301 lb 12.8 oz (136.9 kg)   BMI 42.09 kg/m  Physical Exam Constitutional:      Appearance: Normal appearance. He is obese.  HENT:     Head: Normocephalic and atraumatic.  Eyes:     Extraocular Movements: Extraocular movements intact.     Conjunctiva/sclera: Conjunctivae normal.  Cardiovascular:     Rate and Rhythm: Normal rate and regular rhythm.  Pulmonary:     Effort: Pulmonary effort is normal.     Breath sounds: Normal breath sounds.  Abdominal:     General: Bowel sounds are normal.     Palpations: Abdomen is soft.  Musculoskeletal:        General: Normal range of motion.     Cervical back: Normal range of motion and neck supple.  Skin:    General: Skin is warm.  Neurological:     General: No focal deficit present.     Mental Status: He is alert and oriented to person, place, and time.  Psychiatric:         Mood and Affect: Mood normal.        Behavior: Behavior normal.      Assessment: *Acid reflux *Dysphagia  *Chronic diarrhea  Plan: Will schedule for EGD to evaluate for peptic ulcer disease, esophagitis, gastritis, H. Pylori, duodenitis, or other. Will also evaluate for esophageal stricture, Schatzki's ring, esophageal web or other. The risks including infection, bleed, or perforation as well as benefits, limitations, alternatives and imponderables have been reviewed with the patient. Potential for esophageal dilation, biopsy, etc.  have also been reviewed.    At the same time we will perform colonoscopy with random colon biopsies to evaluate chronic diarrhea .  Differential includes underlying inflammatory bowel disease, microscopic colitis, postinfectious SIBO, irritable bowel syndrome, or other.  The risks including infection, bleed, or perforation as well as benefits, limitations, alternatives and imponderables have been reviewed with the patient.  Questions have been answered. All parties agreeable.  Continue on omeprazole nightly and Pepcid for breakthrough symptoms.   06/16/2020 3:55 PM   Disclaimer: This note was dictated with voice recognition software. Similar sounding words can inadvertently be transcribed and may not be corrected upon review.

## 2020-06-16 NOTE — Telephone Encounter (Signed)
PA submitted via [facility] for TCS/EGD. Pending review. Tracking# [id number]

## 2020-06-20 ENCOUNTER — Encounter: Payer: Self-pay | Admitting: *Deleted

## 2020-06-21 DIAGNOSIS — Z419 Encounter for procedure for purposes other than remedying health state, unspecified: Secondary | ICD-10-CM | POA: Diagnosis not present

## 2020-06-21 NOTE — Telephone Encounter (Signed)
Received fax from [facility] no PA required

## 2020-06-28 ENCOUNTER — Ambulatory Visit: Payer: Medicaid Other | Admitting: Neurology

## 2020-06-28 ENCOUNTER — Encounter: Payer: Self-pay | Admitting: Neurology

## 2020-06-28 VITALS — BP 132/84 | HR 78 | Ht 71.0 in | Wt 294.3 lb

## 2020-06-28 DIAGNOSIS — G4733 Obstructive sleep apnea (adult) (pediatric): Secondary | ICD-10-CM | POA: Diagnosis not present

## 2020-06-28 DIAGNOSIS — Z9989 Dependence on other enabling machines and devices: Secondary | ICD-10-CM | POA: Diagnosis not present

## 2020-06-28 NOTE — Patient Instructions (Signed)
It was nice to meet your today.  You are fully compliant with your CPAP machine.  As you know, the Respironics dream station machine has been recalled by the maker, [employer].  Please register your machine online as you should be eligible for a replacement machine eventually.  We will try to establish you with a local durable medical equipment company.  You should hear from them within the next week.  If you have not heard within 1 week, please call us back so we can look into the hold-up.  In order to qualify for new supplies, we may have to resort to doing another sleep study.  If the need arises, we will request a sleep study and let you know.  If all goes well, we can follow you in 1 year routinely.  You can see one of our nurse practitioners at the time.  Please continue to work on weight loss.  We can certainly consider a referral to ENT to evaluate you for the inspire treatment down the road.   Please continue using your CPAP regularly. While your insurance requires that you use CPAP at least 4 hours each night on 70% of the nights, I recommend, that you not skip any nights and use it throughout the night if you can. Getting used to CPAP and staying with the treatment long term does take time and patience and discipline. Untreated obstructive sleep apnea when it is moderate to severe can have an adverse impact on cardiovascular health and raise her risk for heart disease, arrhythmias, hypertension, congestive heart failure, stroke and diabetes. Untreated obstructive sleep apnea causes sleep disruption, nonrestorative sleep, and sleep deprivation. This can have an impact on your day to day functioning and cause daytime sleepiness and impairment of cognitive function, memory loss, mood disturbance, and problems focussing. Using CPAP regularly can improve these symptoms.

## 2020-06-28 NOTE — Progress Notes (Signed)
Subjective:    Patient ID: Jimmy Tyler is a 45 y.o. male.  HPI     [name], MD, PhD [facility] [address], [address] [place], [place] [address]  Dear [place],   I saw your patient, Jimmy Tyler, upon your kind request, in my sleep clinic today for initial consultation of his sleep disorder, in particular, evaluation of his prior diagnosis of obstructive sleep apnea.  The patient is unaccompanied today.  As you know, Jimmy Tyler is a 45 year old right-handed gentleman with an underlying medical history of allergies, asthma, diabetes, reflux disease, history of non-STEMI, hypertension, hyperlipidemia, testicular hypofunction, umbilical hernia and severe obesity with a BMI of over 40, s/p gastric sleep some 6 years ago, who was previously diagnosed with obstructive sleep apnea several years ago.  He reports that his original diagnosis was about 12 to 13 years ago.  He is on his third CPAP machine.  He has a Teacher, English as a foreign language.  He is aware of the recall on the machines but has not registered his machine.  He denies any high heat exposure or using a ozone based cleaning machine.  He has used a Art therapist full facemask.  Set up date according to online information from the compliance data website was 03/20/2019.  He does not typically go without using his CPAP.  He goes to bed generally around 9 or 10 PM and rise time is generally between 530 and 6 AM.  He does not have night to night nocturia.  His compliance data from 05/29/2020 through 06/27/2020 was reviewed.  He used his machine every night, percent use days greater than 4 hours was 100%, indicating superb compliance with an average usage of 8 hours and 18 minutes, residual AHI 0.5/h, leak 0 seconds, CPAP pressure of 16 cm.  Prior sleep study results are not available for my review today.  He moved from [place] to [place] in July 2021.  He lives with his wife and 50-year-old son, they  have 3 dogs in the household.   I reviewed your office note from 04/26/2020.  His Epworth sleepiness score is 9 out of 24, fatigue severity score is 51 out of 63.  He reports having had gastric sleeve some 6 years ago and was able to lose about 75 pounds but gained most of his weight back.  He reports fatigue and tiredness.  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 He quit smoking several years ago.  He drinks alcohol rarely.  He drinks caffeine in limitation, 1 serving per day on average.  His mother has a history of sleep apnea.  His Past Medical History Is Significant For: Past Medical History:  Diagnosis Date  . Allergy   . Asthma   . Diabetes mellitus without complication ([place])   . GERD (gastroesophageal reflux disease)   . History of Clostridium difficile infection   . History of non-ST elevation myocardial infarction (NSTEMI)   . Hyperlipidemia   . Hypertension   . OSA on CPAP   . Testicular hypofunction   . Umbilical hernia     His Past Surgical History Is Significant For: Past Surgical History:  Procedure Laterality Date  . BARIATRIC SURGERY  07/13/2013  . HIATAL HERNIA REPAIR    . KNEE SURGERY Left 2015   Due to meniscal tear    His Family History Is Significant For:  Family History  Problem Relation Age of Onset  . COPD Mother   . Autism Son   . Skin cancer Maternal Grandmother   . Heart attack Maternal Grandfather     His Social History Is Significant For: Social History   Socioeconomic History  . Marital status: Married    Spouse name: Not on file  . Number of children: Not on file  . Years of education: Not on file  . Highest education level: Not on file  Occupational History  . Not on file  Tobacco Use  . Smoking status: Former Smoker    Packs/day: 1.00    Types: Cigarettes    Quit date: 02/04/2014    Years since quitting: 6.4  . Smokeless tobacco: Never Used   Vaping Use  . Vaping Use: Never used  Substance and Sexual Activity  . Alcohol use: Yes    Comment: occ  . Drug use: Never  . Sexual activity: Yes    Birth control/protection: None  Other Topics Concern  . Not on file  Social History Narrative  . Not on file   Social Determinants of Health   Financial Resource Strain: Not on file  Food Insecurity: Not on file  Transportation Needs: Not on file  Physical Activity: Not on file  Stress: Not on file  Social Connections: Not on file    His Allergies Are:  Allergies  Allergen Reactions  . Statins Other (See Comments)    Body aches.  Failed 3 different ones.  :   His Current Medications Are:  Outpatient Encounter Medications as of 06/28/2020  Medication Sig  . Bempedoic Acid-Ezetimibe (NEXLIZET) 180-10 MG TABS Take 1 tablet by mouth daily.  . Famotidine (PEPCID PO) Take 20-40 mg by mouth daily.  [name] lisinopril (ZESTRIL) 10 MG tablet Take 1 tablet (10 mg total) by mouth daily.  [name] loperamide (IMODIUM) 2 MG capsule Take 6-8 mg by mouth at bedtime.  . metFORMIN (GLUCOPHAGE-XR) 500 MG 24 hr tablet Take 2 tablets (1,000 mg total) by mouth daily with breakfast AND 1 tablet (500 mg total) daily with supper.  . metoprolol tartrate (LOPRESSOR) 25 MG tablet Take 1 tablet (25 mg total) by mouth 2 (two) times daily. (Patient taking differently: Take 25 mg by mouth 2 (two) times daily. When he remembers)  . omeprazole (PRILOSEC) 20 MG capsule Take 20 mg by mouth daily.   No facility-administered encounter medications on file as of 06/28/2020.  :  Review of Systems:  Out of a complete 14 point review of systems, all are reviewed and negative with the exception of these symptoms as listed below: Review of Systems  Neurological:       Here to establish care for cpap. Reports he started his current machine is 47 years old. He recently moved from [place] and needs to be established to start receiving supplies.  Epworth Sleepiness Scale 0= would  never doze 1= slight chance of dozing 2= moderate chance of dozing 3= high chance of dozing  Sitting and reading:1 Watching TV:1 Sitting inactive in a public place (ex. Theater or meeting):1 As a passenger in a car for an hour without a break:1 Lying down to rest in the afternoon:3 Sitting and talking to someone:0 Sitting quietly after lunch (no alcohol):1 In a car, while stopped in traffic:1 Total:9     Objective:  Neurological Exam  Physical Exam Physical Examination:   Vitals:   06/28/20 1232  BP: 132/84  Pulse: 78  SpO2: 98%  General Examination: The patient is a very pleasant 46 y.o. male in no acute distress. He appears well-developed and well-nourished and well groomed.   HEENT: Normocephalic, atraumatic, pupils are equal, round and reactive to light, extraocular tracking is good without limitation to gaze excursion or nystagmus noted. Hearing is grossly intact. Face is symmetric with normal facial animation. Speech is clear with no dysarthria noted. There is no hypophonia. There is no lip, neck/head, jaw or voice tremor. Neck is supple with full range of passive and active motion. There are no carotid bruits on auscultation. Oropharynx exam reveals: mild mouth dryness, adequate dental hygiene and moderate airway crowding, due to prominent uvula, tonsillar size of about 2+.  Tongue protrudes centrally and palate elevates symmetrically.  Mallampati class II, neck circumference of 18-1/2 inches.  Nasal inspection shows narrow nasal passages, no significant deviated septum.  He reports having deviated septum repair.  Chest: Clear to auscultation without wheezing, rhonchi or crackles noted.  Heart: S1+S2+0, regular and normal without murmurs, rubs or gallops noted.   Abdomen: Soft, non-tender and non-distended with normal bowel sounds appreciated on auscultation.  Extremities: There is trace pitting edema in the distal lower extremities bilaterally.   Skin: Warm and dry  without trophic changes noted.   Musculoskeletal: exam reveals no obvious joint deformities, tenderness or joint swelling or erythema.  He reports left knee discomfort.  Neurologically:  Mental status: The patient is awake, alert and oriented in all 4 spheres. His immediate and remote memory, attention, language skills and fund of knowledge are appropriate. There is no evidence of aphasia, agnosia, apraxia or anomia. Speech is clear with normal prosody and enunciation. Thought process is linear. Mood is normal and affect is normal.  Cranial nerves II - XII are as described above under HEENT exam.  Motor exam: Normal bulk, strength and tone is noted. There is no tremor, Romberg is negative. Reflexes are 2+ throughout. Fine motor skills and coordination: grossly intact.  Cerebellar testing: No dysmetria or intention tremor. There is no truncal or gait ataxia.  Sensory exam: intact to light touch in the upper and lower extremities.  Gait, station and balance: He stands Without difficulty, posture is age-appropriate.  He walks without a limp but does report some left knee discomfort.  Assessment and Plan:  In summary, Jimmy Tyler is a very pleasant 45 y.o.-year old male with an underlying medical history of allergies, asthma, diabetes, reflux disease, history of non-STEMI, hypertension, hyperlipidemia, testicular hypofunction, umbilical hernia and severe obesity with a BMI of over 40, s/p gastric sleep some 6 years ago, who presents for evaluation of his obstructive sleep apnea.  He has been on CPAP for several years.  He relocated from [place] to [place] some 7 months ago.  He is established on treatment with a reasonably new machine.  He is advised of the recall on the Philips Respironics CPAP machines and AutoPap machines and is encouraged to register his machine.  He is advised to continue using his current machine until he can get a replacement through the Respironics supplier.  We will  proceed with a new prescription for supplies.  We will try to get him established with a local DME company that also takes his insurance.  We will let him know if we need to update his diagnosis with a repeat sleep study.  If need be, we can consider a sleep study either in the lab or if possible at home.  He is commended on his treatment  adherence.  He has benefited from treatment.  He is advised to continue with full compliance.  He reports that he was previously diagnosed with severe obstructive sleep apnea.  If all goes well, he can follow-up routinely in this clinic in 1 year, he is encouraged to see one of our nurse practitioners.  If he needs to proceed with a sleep study in the interim, as far as insurance requirement, will let him know.  We talked about alternative treatment options as well today.  He is willing to continue with his CPAP for now.  I answered all his questions today and he was in agreement with the plan.   Thank you very much for allowing me to participate in the care of this nice patient. If I can be of any further assistance to you please do not hesitate to call me at [phone].  Sincerely,   [name], MD, PhD

## 2020-06-30 DIAGNOSIS — M25562 Pain in left knee: Secondary | ICD-10-CM | POA: Diagnosis not present

## 2020-07-14 NOTE — Patient Instructions (Signed)
Jimmy Tyler  07/14/2020     @PREFPERIOPPHARMACY @   Your procedure is scheduled on  07/19/2020   Report to [facility] at  [place]   Call this number if you have problems the morning of surgery:  [phone]   Remember:  Follow the diet and prep instructions given to you by the office.                      Take these medicines the morning of surgery with A SIP OF WATER  Pepcid, indomethacin, metoprolol, prilosec, prednisone.    Please brush your teeth.   Do not wear jewelry, make-up or nail polish.  Do not wear lotions, powders, or perfumes, or deodorant.  Do not shave 48 hours prior to surgery.  Men may shave face and neck.  Do not bring valuables to the hospital.  [facility] is not responsible for any belongings or valuables.   Contacts, dentures or bridgework may not be worn into surgery.  Leave your suitcase in the car.  After surgery it may be brought to your room.  For patients admitted to the hospital, discharge time will be determined by your treatment team.  Patients discharged the day of surgery will not be allowed to drive home and they must  have someone with them for 24 hours.    Special instructions:  DO NOT smoke tobacco or vape the morning of your procedure.  Please read over the following fact sheets that you were given. Anesthesia Post-op Instructions and Care and Recovery After Surgery       Upper Endoscopy, Adult, Care After This sheet gives you information about how to care for yourself after your procedure. Your health care provider may also give you more specific instructions. If you have problems or questions, contact your health care provider. What can I expect after the procedure? After the procedure, it is common to have:  A sore throat.  Mild stomach pain or discomfort.  Bloating.  Nausea. Follow these instructions at home:  Follow instructions from your health care provider about what to eat or drink after  your procedure.  Return to your normal activities as told by your health care provider. Ask your health care provider what activities are safe for you.  Take over-the-counter and prescription medicines only as told by your health care provider.  If you were given a sedative during the procedure, it can affect you for several hours. Do not drive or operate machinery until your health care provider says that it is safe.  Keep all follow-up visits as told by your health care provider. This is important.   Contact a health care provider if you have:  A sore throat that lasts longer than one day.  Trouble swallowing. Get help right away if:  You vomit blood or your vomit looks like coffee grounds.  You have: ? A fever. ? Bloody, black, or tarry stools. ? A severe sore throat or you cannot swallow. ? Difficulty breathing. ? Severe pain in your chest or abdomen. Summary  After the procedure, it is common to have a sore throat, mild stomach discomfort, bloating, and nausea.  If you were given a sedative during the procedure, it can affect you for several hours. Do not drive or operate machinery until your health care provider says that it is safe.  Follow instructions from your health care provider about what to eat or drink  after your procedure.  Return to your normal activities as told by your health care provider. This information is not intended to replace advice given to you by your health care provider. Make sure you discuss any questions you have with your health care provider. Document Revised: 05/05/2019 Document Reviewed: 10/07/2017 Elsevier Patient Education  2021 [place].  Colonoscopy, Adult, Care After This sheet gives you information about how to care for yourself after your procedure. Your health care provider may also give you more specific instructions. If you have problems or questions, contact your health care provider. What can I expect after the  procedure? After the procedure, it is common to have:  A small amount of blood in your stool for 24 hours after the procedure.  Some gas.  Mild cramping or bloating of your abdomen. Follow these instructions at home: Eating and drinking  Drink enough fluid to keep your urine pale yellow.  Follow instructions from your health care provider about eating or drinking restrictions.  Resume your normal diet as instructed by your health care provider. Avoid heavy or fried foods that are hard to digest.   Activity  Rest as told by your health care provider.  Avoid sitting for a long time without moving. Get up to take short walks every 1-2 hours. This is important to improve blood flow and breathing. Ask for help if you feel weak or unsteady.  Return to your normal activities as told by your health care provider. Ask your health care provider what activities are safe for you. Managing cramping and bloating  Try walking around when you have cramps or feel bloated.  Apply heat to your abdomen as told by your health care provider. Use the heat source that your health care provider recommends, such as a moist heat pack or a heating pad. ? Place a towel between your skin and the heat source. ? Leave the heat on for 20-30 minutes. ? Remove the heat if your skin turns bright red. This is especially important if you are unable to feel pain, heat, or cold. You may have a greater risk of getting burned.   General instructions  If you were given a sedative during the procedure, it can affect you for several hours. Do not drive or operate machinery until your health care provider says that it is safe.  For the first 24 hours after the procedure: ? Do not sign important documents. ? Do not drink alcohol. ? Do your regular daily activities at a slower pace than normal. ? Eat soft foods that are easy to digest.  Take over-the-counter and prescription medicines only as told by your health care  provider.  Keep all follow-up visits as told by your health care provider. This is important. Contact a health care provider if:  You have blood in your stool 2-3 days after the procedure. Get help right away if you have:  More than a small spotting of blood in your stool.  Large blood clots in your stool.  Swelling of your abdomen.  Nausea or vomiting.  A fever.  Increasing pain in your abdomen that is not relieved with medicine. Summary  After the procedure, it is common to have a small amount of blood in your stool. You may also have mild cramping and bloating of your abdomen.  If you were given a sedative during the procedure, it can affect you for several hours. Do not drive or operate machinery until your health care provider says that  it is safe.  Get help right away if you have a lot of blood in your stool, nausea or vomiting, a fever, or increased pain in your abdomen. This information is not intended to replace advice given to you by your health care provider. Make sure you discuss any questions you have with your health care provider. Document Revised: 05/01/2019 Document Reviewed: 12/01/2018 Elsevier Patient Education  2021 [place] After This sheet gives you information about how to care for yourself after your procedure. Your health care provider may also give you more specific instructions. If you have problems or questions, contact your health care provider. What can I expect after the procedure? After the procedure, it is common to have:  Tiredness.  Forgetfulness about what happened after the procedure.  Impaired judgment for important decisions.  Nausea or vomiting.  Some difficulty with balance. Follow these instructions at home: For the time period you were told by your health care provider:  Rest as needed.  Do not participate in activities where you could fall or become injured.  Do not drive or use  machinery.  Do not drink alcohol.  Do not take sleeping pills or medicines that cause drowsiness.  Do not make important decisions or sign legal documents.  Do not take care of children on your own.      Eating and drinking  Follow the diet that is recommended by your health care provider.  Drink enough fluid to keep your urine pale yellow.  If you vomit: ? Drink water, juice, or soup when you can drink without vomiting. ? Make sure you have little or no nausea before eating solid foods. General instructions  Have a responsible adult stay with you for the time you are told. It is important to have someone help care for you until you are awake and alert.  Take over-the-counter and prescription medicines only as told by your health care provider.  If you have sleep apnea, surgery and certain medicines can increase your risk for breathing problems. Follow instructions from your health care provider about wearing your sleep device: ? Anytime you are sleeping, including during daytime naps. ? While taking prescription pain medicines, sleeping medicines, or medicines that make you drowsy.  Avoid smoking.  Keep all follow-up visits as told by your health care provider. This is important. Contact a health care provider if:  You keep feeling nauseous or you keep vomiting.  You feel light-headed.  You are still sleepy or having trouble with balance after 24 hours.  You develop a rash.  You have a fever.  You have redness or swelling around the IV site. Get help right away if:  You have trouble breathing.  You have new-onset confusion at home. Summary  For several hours after your procedure, you may feel tired. You may also be forgetful and have poor judgment.  Have a responsible adult stay with you for the time you are told. It is important to have someone help care for you until you are awake and alert.  Rest as told. Do not drive or operate machinery. Do not drink  alcohol or take sleeping pills.  Get help right away if you have trouble breathing, or if you suddenly become confused. This information is not intended to replace advice given to you by your health care provider. Make sure you discuss any questions you have with your health care provider. Document Revised: 01/21/2020 Document Reviewed: 04/09/2019 Elsevier Patient Education  2021  [employer].

## 2020-07-15 ENCOUNTER — Other Ambulatory Visit (HOSPITAL_COMMUNITY)
Admission: RE | Admit: 2020-07-15 | Discharge: 2020-07-15 | Disposition: A | Discharge status: Home / Self Care | Payer: Medicaid Other | Source: Ambulatory Visit | Attending: Internal Medicine | Admitting: Internal Medicine

## 2020-07-15 ENCOUNTER — Other Ambulatory Visit: Payer: Self-pay

## 2020-07-15 ENCOUNTER — Encounter (HOSPITAL_COMMUNITY): Payer: Self-pay

## 2020-07-15 ENCOUNTER — Encounter (HOSPITAL_COMMUNITY)
Admission: RE | Admit: 2020-07-15 | Discharge: 2020-07-15 | Disposition: A | Discharge status: Home / Self Care | Payer: Medicaid Other | Source: Ambulatory Visit | Attending: Internal Medicine | Admitting: Internal Medicine

## 2020-07-15 DIAGNOSIS — Z01818 Encounter for other preprocedural examination: Secondary | ICD-10-CM | POA: Diagnosis not present

## 2020-07-15 DIAGNOSIS — Z20822 Contact with and (suspected) exposure to covid-19: Secondary | ICD-10-CM | POA: Diagnosis not present

## 2020-07-15 LAB — BASIC METABOLIC PANEL
Anion gap: 9 (ref 5–15)
BUN: 20 mg/dL (ref 6–20)
CO2: 25 mmol/L (ref 22–32)
Calcium: 8.9 mg/dL (ref 8.9–10.3)
Chloride: 100 mmol/L (ref 98–111)
Creatinine, Ser: 0.95 mg/dL (ref 0.61–1.24)
GFR, Estimated: >60 mL/min (ref >60)
Glucose, Bld: 163 mg/dL — ABNORMAL HIGH (ref 70–99)
Potassium: 4.3 mmol/L (ref 3.5–5.1)
Sodium: 134 mmol/L — ABNORMAL LOW (ref 135–145)

## 2020-07-15 LAB — SARS CORONAVIRUS 2 (TAT 6-24 HRS): SARS Coronavirus 2: NEGATIVE

## 2020-07-19 ENCOUNTER — Ambulatory Visit (HOSPITAL_COMMUNITY): Payer: Medicaid Other | Admitting: Anesthesiology

## 2020-07-19 ENCOUNTER — Encounter (HOSPITAL_COMMUNITY): Payer: Self-pay | Admitting: *Deleted

## 2020-07-19 ENCOUNTER — Other Ambulatory Visit: Payer: Self-pay

## 2020-07-19 ENCOUNTER — Encounter (HOSPITAL_COMMUNITY)
Admission: RE | Disposition: A | Discharge status: Home / Self Care | Payer: Self-pay | Source: Home / Self Care | Attending: Internal Medicine

## 2020-07-19 ENCOUNTER — Ambulatory Visit (HOSPITAL_COMMUNITY)
Admission: RE | Admit: 2020-07-19 | Discharge: 2020-07-19 | Disposition: A | Discharge status: Home / Self Care | Payer: Medicaid Other | Attending: Internal Medicine | Admitting: Internal Medicine

## 2020-07-19 DIAGNOSIS — Z9884 Bariatric surgery status: Secondary | ICD-10-CM | POA: Diagnosis not present

## 2020-07-19 DIAGNOSIS — R12 Heartburn: Secondary | ICD-10-CM | POA: Diagnosis not present

## 2020-07-19 DIAGNOSIS — Z7984 Long term (current) use of oral hypoglycemic drugs: Secondary | ICD-10-CM | POA: Diagnosis not present

## 2020-07-19 DIAGNOSIS — Z419 Encounter for procedure for purposes other than remedying health state, unspecified: Secondary | ICD-10-CM | POA: Diagnosis not present

## 2020-07-19 DIAGNOSIS — R1310 Dysphagia, unspecified: Secondary | ICD-10-CM | POA: Diagnosis not present

## 2020-07-19 DIAGNOSIS — K3189 Other diseases of stomach and duodenum: Secondary | ICD-10-CM | POA: Diagnosis not present

## 2020-07-19 DIAGNOSIS — Z7952 Long term (current) use of systemic steroids: Secondary | ICD-10-CM | POA: Insufficient documentation

## 2020-07-19 DIAGNOSIS — K2970 Gastritis, unspecified, without bleeding: Secondary | ICD-10-CM | POA: Diagnosis not present

## 2020-07-19 DIAGNOSIS — K635 Polyp of colon: Secondary | ICD-10-CM | POA: Diagnosis not present

## 2020-07-19 DIAGNOSIS — Z79899 Other long term (current) drug therapy: Secondary | ICD-10-CM | POA: Insufficient documentation

## 2020-07-19 DIAGNOSIS — G4733 Obstructive sleep apnea (adult) (pediatric): Secondary | ICD-10-CM | POA: Diagnosis not present

## 2020-07-19 DIAGNOSIS — Z888 Allergy status to other drugs, medicaments and biological substances status: Secondary | ICD-10-CM | POA: Insufficient documentation

## 2020-07-19 DIAGNOSIS — K219 Gastro-esophageal reflux disease without esophagitis: Secondary | ICD-10-CM | POA: Diagnosis not present

## 2020-07-19 DIAGNOSIS — R197 Diarrhea, unspecified: Secondary | ICD-10-CM | POA: Diagnosis not present

## 2020-07-19 DIAGNOSIS — K529 Noninfective gastroenteritis and colitis, unspecified: Secondary | ICD-10-CM | POA: Insufficient documentation

## 2020-07-19 DIAGNOSIS — D123 Benign neoplasm of transverse colon: Secondary | ICD-10-CM | POA: Insufficient documentation

## 2020-07-19 DIAGNOSIS — Z87891 Personal history of nicotine dependence: Secondary | ICD-10-CM | POA: Insufficient documentation

## 2020-07-19 DIAGNOSIS — D127 Benign neoplasm of rectosigmoid junction: Secondary | ICD-10-CM | POA: Insufficient documentation

## 2020-07-19 HISTORY — PX: POLYPECTOMY: SHX5525

## 2020-07-19 HISTORY — PX: ESOPHAGOGASTRODUODENOSCOPY (EGD) WITH PROPOFOL: SHX5813

## 2020-07-19 HISTORY — PX: COLONOSCOPY WITH PROPOFOL: SHX5780

## 2020-07-19 HISTORY — PX: BIOPSY: SHX5522

## 2020-07-19 LAB — GLUCOSE, CAPILLARY: Glucose-Capillary: 147 mg/dL — ABNORMAL HIGH (ref 70–99)

## 2020-07-19 SURGERY — COLONOSCOPY WITH PROPOFOL
Anesthesia: General

## 2020-07-19 MED ORDER — PROPOFOL 10 MG/ML IV BOLUS
INTRAVENOUS | Status: DC | PRN
  Administered 2020-07-19 (×2): 50 mg via INTRAVENOUS

## 2020-07-19 MED ORDER — KETAMINE HCL 50 MG/5ML IJ SOSY
PREFILLED_SYRINGE | INTRAMUSCULAR | Status: AC
  Filled 2020-07-19: qty 5

## 2020-07-19 MED ORDER — GLYCOPYRROLATE PF 0.2 MG/ML IJ SOSY
PREFILLED_SYRINGE | INTRAMUSCULAR | Status: AC
  Filled 2020-07-19: qty 1

## 2020-07-19 MED ORDER — KETAMINE HCL 10 MG/ML IJ SOLN
INTRAMUSCULAR | Status: DC | PRN
  Administered 2020-07-19: 20 mg via INTRAVENOUS

## 2020-07-19 MED ORDER — OMEPRAZOLE 20 MG PO CPDR: 20.0000 mg | DELAYED_RELEASE_CAPSULE | Freq: Two times a day (BID) | ORAL | 5 refills | Status: DC

## 2020-07-19 MED ORDER — LIDOCAINE VISCOUS HCL 2 % MT SOLN: 15.0000 mL | Freq: Once | OROMUCOSAL | Status: AC

## 2020-07-19 MED ORDER — LIDOCAINE VISCOUS HCL 2 % MT SOLN
OROMUCOSAL | Status: AC
  Administered 2020-07-19: 15 mL via OROMUCOSAL
  Filled 2020-07-19: qty 15

## 2020-07-19 MED ORDER — PROPOFOL 500 MG/50ML IV EMUL
INTRAVENOUS | Status: DC | PRN
  Administered 2020-07-19: 150 ug/kg/min via INTRAVENOUS

## 2020-07-19 MED ORDER — STERILE WATER FOR IRRIGATION IR SOLN
Status: DC | PRN
  Administered 2020-07-19: 1.5 mL

## 2020-07-19 MED ORDER — LACTATED RINGERS IV SOLN: INTRAVENOUS | Status: DC

## 2020-07-19 NOTE — Anesthesia Postprocedure Evaluation (Signed)
Anesthesia Post Note  Patient: Jimmy Tyler  Procedure(s) Performed: COLONOSCOPY WITH PROPOFOL (N/A ) ESOPHAGOGASTRODUODENOSCOPY (EGD) WITH PROPOFOL (N/A ) BIOPSY POLYPECTOMY  Patient location during evaluation: Short Stay Anesthesia Type: General Level of consciousness: awake and alert and oriented Pain management: pain level controlled Vital Signs Assessment: post-procedure vital signs reviewed and stable Respiratory status: spontaneous breathing Cardiovascular status: blood pressure returned to baseline and stable Postop Assessment: no apparent nausea or vomiting Anesthetic complications: no   No complications documented.   Last Vitals:  Vitals:   07/19/20 0829 07/19/20 1002  BP: 132/71 (!) 117/59  Pulse: 61 64  Resp: 18 16  Temp: 36.8 C 36.6 C  SpO2: 98% 99%    Last Pain:  Vitals:   07/19/20 1002  TempSrc: Oral  PainSc: 0-No pain                 Adler Chartrand

## 2020-07-19 NOTE — Transfer of Care (Signed)
Immediate Anesthesia Transfer of Care Note  Patient: Jimmy Tyler  Procedure(s) Performed: COLONOSCOPY WITH PROPOFOL (N/A ) ESOPHAGOGASTRODUODENOSCOPY (EGD) WITH PROPOFOL (N/A ) BIOPSY POLYPECTOMY  Patient Location: Short Stay  Anesthesia Type:General  Level of Consciousness: awake  Airway & Oxygen Therapy: Patient Spontanous Breathing  Post-op Assessment: Report given to RN  Post vital signs: Reviewed and stable  Last Vitals:  Vitals Value Taken Time  BP 117/59 07/19/20 1002  Temp 36.6 C 07/19/20 1002  Pulse 64 07/19/20 1002  Resp 16 07/19/20 1002  SpO2 99 % 07/19/20 1002    Last Pain:  Vitals:   07/19/20 1002  TempSrc: Oral  PainSc: 0-No pain      Patients Stated Pain Goal: 5 (80/88/11 0315)  Complications: No complications documented.

## 2020-07-19 NOTE — Op Note (Signed)
[facility] Patient Name: Jimmy Tyler Procedure Date: 07/19/2020 9:11 AM MRN: [id number] Date of Birth: 12-25-1975 Attending MD: Elon Alas. Abbey Chatters DO CSN: [id number] Age: 45 Admit Type: Outpatient Procedure:                Upper GI endoscopy Indications:              Heartburn, Suspected esophageal reflux Providers:                Elon Alas. Abbey Chatters, DO, Charlsie Quest. Insurance claims handler, Therapist, sports,                            Suzan Garibaldi. Risa Grill, Technician Referring MD:              Medicines:                See the Anesthesia note for documentation of the                            administered medications Complications:            No immediate complications. Estimated Blood Loss:     Estimated blood loss was minimal. Procedure:                Pre-Anesthesia Assessment:                           - The anesthesia plan was to use monitored                            anesthesia care (MAC).                           After obtaining informed consent, the endoscope was                            passed under direct vision. Throughout the                            procedure, the patient's blood pressure, pulse, and                            oxygen saturations were monitored continuously. The                            [id number]-H190 ([id number]) scope was introduced through the                            mouth, and advanced to the second part of duodenum.                            The upper GI endoscopy was accomplished without                            difficulty. The patient tolerated the procedure  well. Scope In: 9:27:01 AM Scope Out: 9:30:48 AM Total Procedure Duration: 0 hours 3 minutes 47 seconds  Findings:      There is no endoscopic evidence of bleeding, areas of erosion,       esophagitis, hiatal hernia, ulcerations or varices in the entire       esophagus.      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 This was       characterized by healthy  appearing mucosa.      Localized moderate inflammation characterized by erosions and erythema       was found in the gastric antrum. Biopsies were taken with a cold forceps       for Helicobacter pylori testing.      The duodenal bulb, first portion of the duodenum and second portion of       the duodenum were normal. Biopsies for histology were taken with a cold       forceps for evaluation of celiac disease. Impression:               - A sleeve gastrectomy was found, characterized by                            healthy appearing mucosa.                           - Gastritis. Biopsied.                           - Normal duodenal bulb, first portion of the                            duodenum and second portion of the duodenum.                            Biopsied. Moderate Sedation:      Per Anesthesia Care Recommendation:           - Patient has a contact number available for                            emergencies. The signs and symptoms of potential                            delayed complications were discussed with the                            patient. Return to normal activities tomorrow.                            Written discharge instructions were provided to the                            patient.                           - Resume previous diet.                           -  Continue present medications.                           - Await pathology results.                           - Use Prilosec (omeprazole) 20 mg PO BID for 8                            weeks then decrease to once daily.                           - No ibuprofen, naproxen, or other non-steroidal                            anti-inflammatory drugs.                           - Return to GI clinic in 3 months. Procedure Code(s):        --- Professional ---                           [phone], Esophagogastroduodenoscopy, flexible,                            transoral; with biopsy, single or multiple Diagnosis Code(s):         --- Professional ---                           Z98.84, Bariatric surgery status                           K29.70, Gastritis, unspecified, without bleeding                           R12, Heartburn CPT copyright 2019 American Medical Association. All rights reserved. The codes documented in this report are preliminary and upon coder review may  be revised to meet current compliance requirements. Elon Alas. Abbey Chatters, DO [place] Abbey Chatters, DO 07/19/2020 9:35:29 AM This report has been signed electronically. Number of Addenda: 0

## 2020-07-19 NOTE — Discharge Instructions (Signed)
EGD Discharge instructions Please read the instructions outlined below and refer to this sheet in the next few weeks. These discharge instructions provide you with general information on caring for yourself after you leave the hospital. Your doctor may also give you specific instructions. While your treatment has been planned according to the most current medical practices available, unavoidable complications occasionally occur. If you have any problems or questions after discharge, please call your doctor. ACTIVITY  You may resume your regular activity but move at a slower pace for the next 24 hours.   Take frequent rest periods for the next 24 hours.   Walking will help expel (get rid of) the air and reduce the bloated feeling in your abdomen.   No driving for 24 hours (because of the anesthesia (medicine) used during the test).   You may shower.   Do not sign any important legal documents or operate any machinery for 24 hours (because of the anesthesia used during the test).  NUTRITION  Drink plenty of fluids.   You may resume your normal diet.   Begin with a light meal and progress to your normal diet.   Avoid alcoholic beverages for 24 hours or as instructed by your caregiver.  MEDICATIONS  You may resume your normal medications unless your caregiver tells you otherwise.  WHAT YOU CAN EXPECT TODAY  You may experience abdominal discomfort such as a feeling of fullness or "gas" pains.  FOLLOW-UP  Your doctor will discuss the results of your test with you.  SEEK IMMEDIATE MEDICAL ATTENTION IF ANY OF THE FOLLOWING OCCUR:  Excessive nausea (feeling sick to your stomach) and/or vomiting.   Severe abdominal pain and distention (swelling).   Trouble swallowing.   Temperature over 101 F (37.8 C).   Rectal bleeding or vomiting of blood.     Colonoscopy Discharge Instructions  Read the instructions outlined below and refer to this sheet in the next few weeks. These  discharge instructions provide you with general information on caring for yourself after you leave the hospital. Your doctor may also give you specific instructions. While your treatment has been planned according to the most current medical practices available, unavoidable complications occasionally occur.   ACTIVITY  You may resume your regular activity, but move at a slower pace for the next 24 hours.   Take frequent rest periods for the next 24 hours.   Walking will help get rid of the air and reduce the bloated feeling in your belly (abdomen).   No driving for 24 hours (because of the medicine (anesthesia) used during the test).    Do not sign any important legal documents or operate any machinery for 24 hours (because of the anesthesia used during the test).  NUTRITION  Drink plenty of fluids.   You may resume your normal diet as instructed by your doctor.   Begin with a light meal and progress to your normal diet. Heavy or fried foods are harder to digest and may make you feel sick to your stomach (nauseated).   Avoid alcoholic beverages for 24 hours or as instructed.  MEDICATIONS  You may resume your normal medications unless your doctor tells you otherwise.  WHAT YOU CAN EXPECT TODAY  Some feelings of bloating in the abdomen.   Passage of more gas than usual.   Spotting of blood in your stool or on the toilet paper.  IF YOU HAD POLYPS REMOVED DURING THE COLONOSCOPY:  No aspirin products for 7 days or as instructed.  No alcohol for 7 days or as instructed.   Eat a soft diet for the next 24 hours.  FINDING OUT THE RESULTS OF YOUR TEST Not all test results are available during your visit. If your test results are not back during the visit, make an appointment with your caregiver to find out the results. Do not assume everything is normal if you have not heard from your caregiver or the medical facility. It is important for you to follow up on all of your test results.   SEEK IMMEDIATE MEDICAL ATTENTION IF:  You have more than a spotting of blood in your stool.   Your belly is swollen (abdominal distention).   You are nauseated or vomiting.   You have a temperature over 101.   You have abdominal pain or discomfort that is severe or gets worse throughout the day.   Your EGD showed mild amount inflammation in her stomach.  I took biopsies of this to rule out infection with a bacteria called H. pylori.  I want you to increase your omeprazole to 20 mg twice daily and I sent in a new prescription to your pharmacy.  Okay to take famotidine on an as-needed basis for breakthrough symptoms.   Your colonoscopy revealed 7 polyp(s) which I removed successfully. Await pathology results, my office will contact you. I recommend repeating colonoscopy in 3 years for surveillance purposes.   Follow up with GI in 2-3 months   I hope you have a great rest of your week!  Elon Alas. Abbey Chatters, D.O. Gastroenterology and Hepatology [facility]

## 2020-07-19 NOTE — H&P (Signed)
Primary Care Physician:  Loman Brooklyn, FNP Primary Gastroenterologist:  Dr. Abbey Chatters  Pre-Procedure History & Physical: HPI:  Jimmy Tyler is a 45 y.o. male is here for an EGD for reflux and colonoscopy to be performed for chronic diarrhea.    He states he has had GI issues since his 90s.  Notes mainly chronic diarrhea which was self-limiting until approximately 1 to 2 years ago when he was hospitalized with C. difficile colitis.  He was treated for C. difficile and did well but ever since then his diarrhea has progressively gotten worse.  Notes multiple loose bowel movements daily.  He states he is taking up to 4 Imodium's at night which has improved his symptoms vastly.  States as long as he takes 4 Imodium before bed he will have 1-2 normal loose bowel movements a day.  No melena hematochezia no unintentional weight loss.  Celiac panel was negative.  Also has chronic reflux which is improved after recently starting omeprazole 20 mg nightly.  Also takes famotidine as well.  No chronic NSAID use.  Does note occasional dysphagia primarily with solids.  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  Also looks like he had a hiatal hernia repair at the same time.  Past Medical History:  Diagnosis Date  . Allergy   . Asthma   . Diabetes mellitus without complication ([place])   . GERD (gastroesophageal reflux disease)   . History of Clostridium difficile infection   . History of non-ST elevation myocardial infarction (NSTEMI)   . Hyperlipidemia   . Hypertension   . Myocardial infarction ([place]) 2014  . OSA on CPAP   . Testicular hypofunction   . Umbilical hernia     Past Surgical History:  Procedure Laterality Date  . BARIATRIC SURGERY  07/13/2013  . HIATAL HERNIA REPAIR    . KNEE SURGERY Left 2015   Due to meniscal tear  . NASAL SEPTUM SURGERY  03/2019    Prior to Admission medications   Medication Sig Start Date End Date Taking? Authorizing Provider  Bempedoic Acid-Ezetimibe (NEXLIZET)  180-10 MG TABS Take 1 tablet by mouth daily. Patient taking differently: Take 1 tablet by mouth at bedtime. 02/10/20  Yes Hendricks Limes F, FNP  Famotidine (PEPCID PO) Take 20 mg by mouth at bedtime.   Yes [provider]  indomethacin (INDOCIN) 25 MG capsule Take 25 mg by mouth 2 (two) times daily. 06/30/20  Yes [provider]  lisinopril (ZESTRIL) 10 MG tablet Take 1 tablet (10 mg total) by mouth daily. Patient taking differently: Take 10 mg by mouth at bedtime. 04/19/20  Yes Ronnie Doss M, DO  loperamide (IMODIUM) 2 MG capsule Take 6-8 mg by mouth at bedtime.   Yes [provider]  metFORMIN (GLUCOPHAGE-XR) 500 MG 24 hr tablet Take 2 tablets (1,000 mg total) by mouth daily with breakfast AND 1 tablet (500 mg total) daily with supper. 02/10/20  Yes Loman Brooklyn, FNP  metoprolol tartrate (LOPRESSOR) 25 MG tablet Take 1 tablet (25 mg total) by mouth 2 (two) times daily. 04/19/20  Yes Gottschalk, Leatrice Jewels M, DO  omeprazole (PRILOSEC) 20 MG capsule Take 20 mg by mouth daily as needed (if don't have famodine).   Yes [provider]  predniSONE (DELTASONE) 10 MG tablet Take 10 mg by mouth 2 (two) times daily with a meal.   Yes [provider]    Allergies as of 06/16/2020 - Review Complete 06/16/2020  Allergen Reaction Noted  . Statins  Other (See Comments) 02/07/2020    Family History  Problem Relation Age of Onset  . COPD Mother   . Autism Son   . Skin cancer Maternal Grandmother   . Heart attack Maternal Grandfather     Social History   Socioeconomic History  . Marital status: Married    Spouse name: Not on file  . Number of children: Not on file  . Years of education: Not on file  . Highest education level: Not on file  Occupational History  . Not on file  Tobacco Use  . Smoking status: Former Smoker    Packs/day: 1.00    Types: Cigarettes    Quit date: 02/04/2014    Years since quitting: 6.4  . Smokeless tobacco: Never Used   Vaping Use  . Vaping Use: Never used  Substance and Sexual Activity  . Alcohol use: Yes    Comment: occ  . Drug use: Never  . Sexual activity: Yes    Birth control/protection: None  Other Topics Concern  . Not on file  Social History Narrative  . Not on file   Social Determinants of Health   Financial Resource Strain: Not on file  Food Insecurity: Not on file  Transportation Needs: Not on file  Physical Activity: Not on file  Stress: Not on file  Social Connections: Not on file  Intimate Partner Violence: Not on file    Review of Systems: See HPI, otherwise negative ROS  Physical Exam: Vital signs in last 24 hours: Temp:  [98.3 F (36.8 C)] 98.3 F (36.8 C) (03/01 0829) Pulse Rate:  [61] 61 (03/01 0829) Resp:  [18] 18 (03/01 0829) BP: (132)/(71) 132/71 (03/01 0829) SpO2:  [98 %] 98 % (03/01 0829)   General:   Alert,  Well-developed, well-nourished, pleasant and cooperative in NAD Head:  Normocephalic and atraumatic. Eyes:  Sclera clear, no icterus.   Conjunctiva pink. Ears:  Normal auditory acuity. Nose:  No deformity, discharge,  or lesions. Mouth:  No deformity or lesions, dentition normal. Neck:  Supple; no masses or thyromegaly. Lungs:  Clear throughout to auscultation.   No wheezes, crackles, or rhonchi. No acute distress. Heart:  Regular rate and rhythm; no murmurs, clicks, rubs,  or gallops. Abdomen:  Soft, nontender and nondistended. No masses, hepatosplenomegaly or hernias noted. Normal bowel sounds, without guarding, and without rebound.   Msk:  Symmetrical without gross deformities. Normal posture. Extremities:  Without clubbing or edema. Neurologic:  Alert and  oriented x4;  grossly normal neurologically. Skin:  Intact without significant lesions or rashes. Cervical Nodes:  No significant cervical adenopathy. Psych:  Alert and cooperative. Normal mood and affect.  Impression/Plan: [name] is here for an EGD for reflux and colonoscopy to be  performed for chronic diarrhea.   The risks of the procedure including infection, bleed, or perforation as well as benefits, limitations, alternatives and imponderables have been reviewed with the patient. Questions have been answered. All parties agreeable.

## 2020-07-19 NOTE — Anesthesia Preprocedure Evaluation (Addendum)
Anesthesia Evaluation  Patient identified by MRN, date of birth, ID band Patient awake    Reviewed: Allergy & Precautions, NPO status , Patient's Chart, lab work & pertinent test results  Airway Mallampati: II  TM Distance: >3 FB Neck ROM: Full    Dental  (+) Dental Advisory Given, Teeth Intact   Pulmonary asthma , sleep apnea , former smoker,    Pulmonary exam normal breath sounds clear to auscultation       Cardiovascular Exercise Tolerance: Good hypertension, Pt. on medications + Past MI  Normal cardiovascular exam Rhythm:Regular Rate:Normal     Neuro/Psych negative psych ROS   GI/Hepatic Neg liver ROS, GERD  Medicated and Controlled,  Endo/Other  diabetes, Well Controlled, Type 2, Oral Hypoglycemic Agents  Renal/GU negative Renal ROS     Musculoskeletal negative musculoskeletal ROS (+)   Abdominal   Peds  Hematology negative hematology ROS (+)   Anesthesia Other Findings   Reproductive/Obstetrics negative OB ROS                           Anesthesia Physical Anesthesia Plan  ASA: III  Anesthesia Plan: General   Post-op Pain Management:    Induction: Intravenous  PONV Risk Score and Plan: TIVA  Airway Management Planned: Nasal Cannula, Natural Airway and Simple Face Mask  Additional Equipment:   Intra-op Plan:   Post-operative Plan:   Informed Consent: I have reviewed the patients History and Physical, chart, labs and discussed the procedure including the risks, benefits and alternatives for the proposed anesthesia with the patient or authorized representative who has indicated his/her understanding and acceptance.     Dental advisory given  Plan Discussed with: CRNA and Surgeon  Anesthesia Plan Comments:         Anesthesia Quick Evaluation

## 2020-07-19 NOTE — Op Note (Signed)
[facility] Patient Name: Jimmy Tyler Procedure Date: 07/19/2020 9:33 AM MRN: [id number] Date of Birth: [birth year] Attending MD: Elon Alas. Abbey Chatters DO CSN: [id number] Age: 45 Admit Type: Outpatient Procedure:                Colonoscopy Indications:              Chronic diarrhea Providers:                Elon Alas. Abbey Chatters, DO, Selena Lesser RN, RN,                            Teressa Senter Tech, Technician Referring MD:              Medicines:                See the Anesthesia note for documentation of the                            administered medications Complications:            No immediate complications. Estimated Blood Loss:     Estimated blood loss was minimal. Procedure:                Pre-Anesthesia Assessment:                           - The anesthesia plan was to use monitored                            anesthesia care (MAC).                           After obtaining informed consent, the colonoscope                            was passed under direct vision. Throughout the                            procedure, the patient's blood pressure, pulse, and                            oxygen saturations were monitored continuously. The                            [id number]-H190DL ([id number]) scope was introduced through                            the anus and advanced to the the cecum, identified                            by appendiceal orifice and ileocecal valve. The                            colonoscopy was performed without difficulty. The                            patient tolerated the procedure well. The quality  of the bowel preparation was evaluated using the                            BBPS [facility] Bowel Preparation Scale) with scores                            of: Right Colon = 3, Transverse Colon = 3 and Left                            Colon = 3 (entire mucosa seen well with no residual                            staining, small fragments of  stool or opaque                            liquid). The total BBPS score equals 9. Scope In: 9:36:07 AM Scope Out: 9:54:04 AM Scope Withdrawal Time: 0 hours 16 minutes 14 seconds  Total Procedure Duration: 0 hours 17 minutes 57 seconds  Findings:      The perianal and digital rectal examinations were normal.      Three sessile polyps were found in the transverse colon. The polyps were       4 to 6 mm in size. These polyps were removed with a cold snare.       Resection and retrieval were complete.      Four sessile polyps were found in the recto-sigmoid colon and sigmoid       colon. The polyps were 5 to 7 mm in size. These polyps were removed with       a cold snare. Resection and retrieval were complete.      Biopsies for histology were taken with a cold forceps from the ascending       colon, transverse colon and descending colon for evaluation of       microscopic colitis. Impression:               - Three 4 to 6 mm polyps in the transverse colon,                            removed with a cold snare. Resected and retrieved.                           - Four 5 to 7 mm polyps at the recto-sigmoid colon                            and in the sigmoid colon, removed with a cold                            snare. Resected and retrieved.                           - Biopsies were taken with a cold forceps from the                            ascending colon, transverse colon and  descending                            colon for evaluation of microscopic colitis. Moderate Sedation:      Per Anesthesia Care Recommendation:           - Patient has a contact number available for                            emergencies. The signs and symptoms of potential                            delayed complications were discussed with the                            patient. Return to normal activities tomorrow.                            Written discharge instructions were provided to the                             patient.                           - Resume previous diet.                           - Continue present medications.                           - Await pathology results.                           - Repeat colonoscopy in 3 years for surveillance.                           - Return to GI clinic in 3 months. Procedure Code(s):        --- Professional ---                           [phone], Colonoscopy, flexible; with removal of                            tumor(s), polyp(s), or other lesion(s) by snare                            technique                           [id number], 72, Colonoscopy, flexible; with biopsy,                            single or multiple Diagnosis Code(s):        --- Professional ---                           K63.5, Polyp of colon  K52.9, Noninfective gastroenteritis and colitis,                            unspecified CPT copyright 2019 American Medical Association. All rights reserved. The codes documented in this report are preliminary and upon coder review may  be revised to meet current compliance requirements. Elon Alas. Abbey Chatters, DO [place] Abbey Chatters, DO 07/19/2020 9:58:36 AM This report has been signed electronically. Number of Addenda: 0

## 2020-07-20 LAB — SURGICAL PATHOLOGY

## 2020-07-23 ENCOUNTER — Other Ambulatory Visit: Payer: Self-pay | Admitting: Family Medicine

## 2020-07-23 DIAGNOSIS — E785 Hyperlipidemia, unspecified: Secondary | ICD-10-CM

## 2020-07-25 ENCOUNTER — Encounter (HOSPITAL_COMMUNITY): Payer: Self-pay | Admitting: Internal Medicine

## 2020-07-25 ENCOUNTER — Other Ambulatory Visit: Payer: Self-pay | Admitting: *Deleted

## 2020-07-25 MED ORDER — LISINOPRIL 10 MG PO TABS: 10.0000 mg | ORAL_TABLET | Freq: Every day | ORAL | 0 refills | Status: DC

## 2020-07-28 ENCOUNTER — Emergency Department (HOSPITAL_COMMUNITY): Payer: Medicaid Other

## 2020-07-28 ENCOUNTER — Encounter (HOSPITAL_COMMUNITY)
Admission: EM | Disposition: A | Discharge status: Home / Self Care | Payer: Self-pay | Source: Home / Self Care | Attending: Emergency Medicine

## 2020-07-28 ENCOUNTER — Encounter (HOSPITAL_COMMUNITY): Payer: Self-pay | Admitting: *Deleted

## 2020-07-28 ENCOUNTER — Observation Stay (HOSPITAL_BASED_OUTPATIENT_CLINIC_OR_DEPARTMENT_OTHER): Payer: Medicaid Other

## 2020-07-28 ENCOUNTER — Observation Stay (HOSPITAL_COMMUNITY)
Admission: EM | Admit: 2020-07-28 | Discharge: 2020-07-29 | Disposition: A | Discharge status: Home / Self Care | Payer: Medicaid Other | Attending: Cardiovascular Disease | Admitting: Cardiovascular Disease

## 2020-07-28 ENCOUNTER — Other Ambulatory Visit: Payer: Self-pay

## 2020-07-28 DIAGNOSIS — E119 Type 2 diabetes mellitus without complications: Secondary | ICD-10-CM | POA: Insufficient documentation

## 2020-07-28 DIAGNOSIS — E1165 Type 2 diabetes mellitus with hyperglycemia: Secondary | ICD-10-CM

## 2020-07-28 DIAGNOSIS — R079 Chest pain, unspecified: Secondary | ICD-10-CM | POA: Diagnosis not present

## 2020-07-28 DIAGNOSIS — Z20822 Contact with and (suspected) exposure to covid-19: Secondary | ICD-10-CM | POA: Insufficient documentation

## 2020-07-28 DIAGNOSIS — Z87891 Personal history of nicotine dependence: Secondary | ICD-10-CM | POA: Diagnosis not present

## 2020-07-28 DIAGNOSIS — Z7984 Long term (current) use of oral hypoglycemic drugs: Secondary | ICD-10-CM | POA: Insufficient documentation

## 2020-07-28 DIAGNOSIS — I214 Non-ST elevation (NSTEMI) myocardial infarction: Secondary | ICD-10-CM | POA: Diagnosis not present

## 2020-07-28 DIAGNOSIS — I2510 Atherosclerotic heart disease of native coronary artery without angina pectoris: Secondary | ICD-10-CM | POA: Diagnosis not present

## 2020-07-28 DIAGNOSIS — Z7982 Long term (current) use of aspirin: Secondary | ICD-10-CM | POA: Diagnosis not present

## 2020-07-28 DIAGNOSIS — I10 Essential (primary) hypertension: Secondary | ICD-10-CM | POA: Diagnosis not present

## 2020-07-28 DIAGNOSIS — J45909 Unspecified asthma, uncomplicated: Secondary | ICD-10-CM | POA: Insufficient documentation

## 2020-07-28 DIAGNOSIS — E785 Hyperlipidemia, unspecified: Secondary | ICD-10-CM

## 2020-07-28 DIAGNOSIS — T148XXA Other injury of unspecified body region, initial encounter: Secondary | ICD-10-CM

## 2020-07-28 DIAGNOSIS — I739 Peripheral vascular disease, unspecified: Secondary | ICD-10-CM

## 2020-07-28 DIAGNOSIS — E1169 Type 2 diabetes mellitus with other specified complication: Secondary | ICD-10-CM | POA: Diagnosis not present

## 2020-07-28 DIAGNOSIS — Z79899 Other long term (current) drug therapy: Secondary | ICD-10-CM | POA: Insufficient documentation

## 2020-07-28 HISTORY — PX: LEFT HEART CATH AND CORONARY ANGIOGRAPHY: CATH118249

## 2020-07-28 LAB — COMPREHENSIVE METABOLIC PANEL
ALT: 51 U/L — ABNORMAL HIGH (ref 0–44)
AST: 30 U/L (ref 15–41)
Albumin: 3.8 g/dL (ref 3.5–5.0)
Alkaline Phosphatase: 52 U/L (ref 38–126)
Anion gap: 9 (ref 5–15)
BUN: 13 mg/dL (ref 6–20)
CO2: 24 mmol/L (ref 22–32)
Calcium: 8.8 mg/dL — ABNORMAL LOW (ref 8.9–10.3)
Chloride: 99 mmol/L (ref 98–111)
Creatinine, Ser: 0.82 mg/dL (ref 0.61–1.24)
GFR, Estimated: >60 mL/min (ref >60)
Glucose, Bld: 283 mg/dL — ABNORMAL HIGH (ref 70–99)
Potassium: 4.2 mmol/L (ref 3.5–5.1)
Sodium: 132 mmol/L — ABNORMAL LOW (ref 135–145)
Total Bilirubin: 0.4 mg/dL (ref 0.3–1.2)
Total Protein: 7.2 g/dL (ref 6.5–8.1)

## 2020-07-28 LAB — CBC
HCT: 39.5 % (ref 39.0–52.0)
Hemoglobin: 13.5 g/dL (ref 13.0–17.0)
MCH: 31.6 pg (ref 26.0–34.0)
MCHC: 34.2 g/dL (ref 30.0–36.0)
MCV: 92.5 fL (ref 80.0–100.0)
Platelets: 225 10*3/uL (ref 150–400)
RBC: 4.27 MIL/uL (ref 4.22–5.81)
RDW: 13.1 % (ref 11.5–15.5)
WBC: 5.3 10*3/uL (ref 4.0–10.5)
nRBC: 0 % (ref 0.0–0.2)

## 2020-07-28 LAB — TROPONIN I (HIGH SENSITIVITY)
Troponin I (High Sensitivity): 450 ng/L (ref <18)
Troponin I (High Sensitivity): 2 ng/L (ref <18)
Troponin I (High Sensitivity): 13 ng/L (ref <18)
Troponin I (High Sensitivity): 16 ng/L (ref <18)

## 2020-07-28 LAB — ECHOCARDIOGRAM COMPLETE
Area-P 1/2: 3.08 cm2
Height: 71 in
S' Lateral: 2.4 cm
Weight: 4720 oz

## 2020-07-28 LAB — GLUCOSE, CAPILLARY
Glucose-Capillary: 107 mg/dL — ABNORMAL HIGH (ref 70–99)
Glucose-Capillary: 187 mg/dL — ABNORMAL HIGH (ref 70–99)

## 2020-07-28 LAB — HIV ANTIBODY (ROUTINE TESTING W REFLEX): HIV Screen 4th Generation wRfx: NONREACTIVE

## 2020-07-28 LAB — RESP PANEL BY RT-PCR (FLU A&B, COVID) ARPGX2
Influenza A by PCR: NEGATIVE
Influenza B by PCR: NEGATIVE
SARS Coronavirus 2 by RT PCR: NEGATIVE

## 2020-07-28 LAB — HEMOGLOBIN A1C
Hgb A1c MFr Bld: 7.6 % — ABNORMAL HIGH (ref 4.8–5.6)
Mean Plasma Glucose: 171.42 mg/dL

## 2020-07-28 SURGERY — LEFT HEART CATH AND CORONARY ANGIOGRAPHY
Anesthesia: LOCAL

## 2020-07-28 MED ORDER — INSULIN ASPART 100 UNIT/ML ~~LOC~~ SOLN
0.0000 [IU] | Freq: Three times a day (TID) | SUBCUTANEOUS | Status: DC
  Administered 2020-07-29: 2 [IU] via SUBCUTANEOUS
  Administered 2020-07-29: 3 [IU] via SUBCUTANEOUS

## 2020-07-28 MED ORDER — BEMPEDOIC ACID-EZETIMIBE 180-10 MG PO TABS: 1.0000 | ORAL_TABLET | Freq: Every day | ORAL | Status: DC

## 2020-07-28 MED ORDER — SODIUM CHLORIDE 0.9% FLUSH: 3.0000 mL | INTRAVENOUS | Status: DC | PRN

## 2020-07-28 MED ORDER — HEPARIN BOLUS VIA INFUSION
4000.0000 [IU] | Freq: Once | INTRAVENOUS | Status: AC
  Administered 2020-07-28: 4000 [IU] via INTRAVENOUS

## 2020-07-28 MED ORDER — NITROGLYCERIN 0.4 MG SL SUBL: 0.4000 mg | SUBLINGUAL_TABLET | SUBLINGUAL | Status: DC | PRN

## 2020-07-28 MED ORDER — ONDANSETRON HCL 4 MG/2ML IJ SOLN: 4.0000 mg | Freq: Four times a day (QID) | INTRAMUSCULAR | Status: DC | PRN

## 2020-07-28 MED ORDER — MIDAZOLAM HCL 2 MG/2ML IJ SOLN
INTRAMUSCULAR | Status: AC
  Filled 2020-07-28: qty 2

## 2020-07-28 MED ORDER — HEPARIN (PORCINE) IN NACL 1000-0.9 UT/500ML-% IV SOLN
INTRAVENOUS | Status: DC | PRN
  Administered 2020-07-28 (×2): 500 mL

## 2020-07-28 MED ORDER — HEPARIN SODIUM (PORCINE) 1000 UNIT/ML IJ SOLN
INTRAMUSCULAR | Status: AC
  Filled 2020-07-28: qty 1

## 2020-07-28 MED ORDER — SODIUM CHLORIDE 0.9% FLUSH
3.0000 mL | Freq: Two times a day (BID) | INTRAVENOUS | Status: DC
  Administered 2020-07-28 – 2020-07-29 (×2): 3 mL via INTRAVENOUS

## 2020-07-28 MED ORDER — SODIUM CHLORIDE 0.9 % IV SOLN: INTRAVENOUS | Status: AC

## 2020-07-28 MED ORDER — ACETAMINOPHEN 325 MG PO TABS: 650.0000 mg | ORAL_TABLET | ORAL | Status: DC | PRN

## 2020-07-28 MED ORDER — LIDOCAINE HCL (PF) 1 % IJ SOLN
INTRAMUSCULAR | Status: AC
  Filled 2020-07-28: qty 30

## 2020-07-28 MED ORDER — ASPIRIN 81 MG PO CHEW
324.0000 mg | CHEWABLE_TABLET | Freq: Once | ORAL | Status: DC
  Filled 2020-07-28: qty 4

## 2020-07-28 MED ORDER — HYDRALAZINE HCL 20 MG/ML IJ SOLN: 10.0000 mg | INTRAMUSCULAR | Status: AC | PRN

## 2020-07-28 MED ORDER — SODIUM CHLORIDE 0.9 % IV SOLN
INTRAVENOUS | Status: DC
Start: 2020-07-28 — End: 2020-07-29

## 2020-07-28 MED ORDER — MIDAZOLAM HCL 2 MG/2ML IJ SOLN
INTRAMUSCULAR | Status: DC | PRN
  Administered 2020-07-28: 2 mg via INTRAVENOUS

## 2020-07-28 MED ORDER — VERAPAMIL HCL 2.5 MG/ML IV SOLN
INTRAVENOUS | Status: AC
  Filled 2020-07-28: qty 2

## 2020-07-28 MED ORDER — HEPARIN (PORCINE) IN NACL 1000-0.9 UT/500ML-% IV SOLN
INTRAVENOUS | Status: AC
  Filled 2020-07-28: qty 1000

## 2020-07-28 MED ORDER — VERAPAMIL HCL 2.5 MG/ML IV SOLN
INTRAVENOUS | Status: DC | PRN
  Administered 2020-07-28: 10 mL via INTRA_ARTERIAL

## 2020-07-28 MED ORDER — IOHEXOL 350 MG/ML SOLN
INTRAVENOUS | Status: DC | PRN
  Administered 2020-07-28: 70 mL

## 2020-07-28 MED ORDER — FENTANYL CITRATE (PF) 100 MCG/2ML IJ SOLN
INTRAMUSCULAR | Status: AC
  Filled 2020-07-28: qty 2

## 2020-07-28 MED ORDER — ASPIRIN EC 81 MG PO TBEC
81.0000 mg | DELAYED_RELEASE_TABLET | Freq: Every day | ORAL | Status: DC
  Administered 2020-07-29: 81 mg via ORAL
  Filled 2020-07-28: qty 1

## 2020-07-28 MED ORDER — SODIUM CHLORIDE 0.9 % IV SOLN: 250.0000 mL | INTRAVENOUS | Status: DC | PRN

## 2020-07-28 MED ORDER — HEPARIN SODIUM (PORCINE) 1000 UNIT/ML IJ SOLN
INTRAMUSCULAR | Status: DC | PRN
  Administered 2020-07-28: 6000 [IU] via INTRAVENOUS

## 2020-07-28 MED ORDER — FENTANYL CITRATE (PF) 100 MCG/2ML IJ SOLN
INTRAMUSCULAR | Status: DC | PRN
  Administered 2020-07-28: 25 ug via INTRAVENOUS

## 2020-07-28 MED ORDER — HEPARIN (PORCINE) 25000 UT/250ML-% IV SOLN
1100.0000 [IU]/h | INTRAVENOUS | Status: DC
  Administered 2020-07-28: 1100 [IU]/h via INTRAVENOUS
  Filled 2020-07-28: qty 250

## 2020-07-28 MED ORDER — LIDOCAINE HCL (PF) 1 % IJ SOLN
INTRAMUSCULAR | Status: DC | PRN
  Administered 2020-07-28: 2 mL

## 2020-07-28 MED ORDER — LABETALOL HCL 5 MG/ML IV SOLN: 10.0000 mg | INTRAVENOUS | Status: AC | PRN

## 2020-07-28 MED ORDER — PANTOPRAZOLE SODIUM 40 MG PO TBEC
40.0000 mg | DELAYED_RELEASE_TABLET | Freq: Every day | ORAL | Status: DC
  Administered 2020-07-29: 40 mg via ORAL
  Filled 2020-07-28: qty 1

## 2020-07-28 MED ORDER — METOPROLOL TARTRATE 25 MG PO TABS
25.0000 mg | ORAL_TABLET | Freq: Two times a day (BID) | ORAL | Status: DC
  Administered 2020-07-28 – 2020-07-29 (×2): 25 mg via ORAL
  Filled 2020-07-28 (×2): qty 1

## 2020-07-28 SURGICAL SUPPLY — 11 items
CATH INFINITI 5 FR JL3.5 (CATHETERS) ×2 IMPLANT
CATH OPTITORQUE TIG 4.0 5F (CATHETERS) ×2 IMPLANT
DEVICE RAD COMP TR BAND LRG (VASCULAR PRODUCTS) ×2 IMPLANT
GLIDESHEATH SLEND SS 6F .021 (SHEATH) ×2 IMPLANT
GUIDEWIRE INQWIRE 1.5J.035X260 (WIRE) ×1 IMPLANT
INQWIRE 1.5J .035X260CM (WIRE) ×2
KIT HEART LEFT (KITS) ×2 IMPLANT
MAT PREVALON FULL STRYKER (MISCELLANEOUS) ×2 IMPLANT
PACK CARDIAC CATHETERIZATION (CUSTOM PROCEDURE TRAY) ×2 IMPLANT
TRANSDUCER W/STOPCOCK (MISCELLANEOUS) ×2 IMPLANT
TUBING CIL FLEX 10 FLL-RA (TUBING) ×2 IMPLANT

## 2020-07-28 NOTE — ED Notes (Signed)
[facility] notified bed ready

## 2020-07-28 NOTE — Progress Notes (Signed)
ANTICOAGULATION CONSULT NOTE - Initial Consult  Pharmacy Consult for heparin gtt  Indication: chest pain/ACS  Allergies  Allergen Reactions  . Gramineae Pollens   . Statins Other (See Comments)    Body aches.  Failed 3 different ones.    Patient Measurements: Height: 5\' 11"  (180.3 cm) Weight: 133.8 kg (295 lb) IBW/kg (Calculated) : 75.3 Heparin Dosing Weight: HEPARIN DW (KG): 106   Vital Signs: Temp: 98.2 F (36.8 C) (03/10 1031) Temp Source: Oral (03/10 1031) BP: 133/76 (03/10 1130) Pulse Rate: 58 (03/10 1130)  Labs: Recent Labs    07/28/20 1033  HGB 13.5  HCT 39.5  PLT 225  CREATININE 0.82    Estimated Creatinine Clearance: 157.1 mL/min (by C-G formula based on SCr of 0.82 mg/dL).   Medical History: Past Medical History:  Diagnosis Date  . Allergy   . Asthma   . Diabetes mellitus without complication ([place])   . GERD (gastroesophageal reflux disease)   . History of Clostridium difficile infection   . History of non-ST elevation myocardial infarction (NSTEMI)   . Hyperlipidemia   . Hypertension   . Myocardial infarction ([place]) 2014  . OSA on CPAP   . Testicular hypofunction   . Umbilical hernia     Medications:  (Not in a hospital admission)  Scheduled:  . aspirin  324 mg Oral Once  . heparin  4,000 Units Intravenous Once   Infusions:  . heparin     PRN: nitroGLYCERIN Anti-infectives (From admission, onward)   None      Assessment: Jimmy Tyler a 45 y.o. male requires anticoagulation with a heparin iv infusion for the indication of  chest pain/ACS. Heparin gtt will be started following pharmacy protocol per pharmacy consult. Patient is not on previous oral anticoagulant that will require aPTT/HL correlation before transitioning to only HL monitoring.   Goal of Therapy:  Heparin level 0.3-0.7 units/ml Monitor platelets by anticoagulation protocol: Yes   Plan:  Give 4000 units bolus x 1 Start heparin infusion at 1100 units/hr Check  anti-Xa level in 6 hours and daily while on heparin Continue to monitor H&H and platelets  Heparin level to be drawn in 6 hours.  [name] 07/28/2020,11:50 AM

## 2020-07-28 NOTE — ED Provider Notes (Signed)
[facility] EMERGENCY DEPARTMENT Provider Note   CSN: [id number] Arrival date & time: 07/28/20  1761     History Chief Complaint  Patient presents with  . Chest Pain    Jimmy Tyler is a 45 y.o. male with past medical history of asthma, diabetes, GERD, history of NSTEMI 8 years ago that presents the emergency department today for evaluation of chest pain.  Patient states that chest pain started last night, is intermittent throughout the day.  Patient states that he went to urgent care this morning to be evaluated who referred him here.  Patient states that while he has been here in the waiting room, his chest pain has gotten better, however still feels "abnormal."  States that his chest pain is on the left side and sometimes will radiate down into his left arm, denies any numbness or tingling.  He states that he is unsure if this feels similar to his previous heart attack.  States that he has not had any cardiac issues since his last NSTEMI, states that he did have a negative stress test about 7 years ago.  Has not seen a cardiologist since. NSTEMI was in [place].  States that he has a lot of anxiety in the past week and states that he is an anxious person therefore he wanted to have this evaluated.  States that he lost 2 people in his family and his wife is planning on leaving him which she found out yesterday.  Patient also states that he feels as if his left back is tight causing strain onto his left arm.  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  Denies any shortness of breath nausea vomiting.  Denies any fevers, chills, cough or URI symptoms.   Denies any leg swelling or weight gain.  Denies any alcohol or drug use. [name]  HPI     Past Medical History:  Diagnosis Date  . Allergy   . Asthma   . Diabetes mellitus without complication ([place])   . GERD (gastroesophageal reflux disease)   . History of Clostridium difficile infection   . History of non-ST elevation myocardial infarction  (NSTEMI)   . Hyperlipidemia   . Hypertension   . Myocardial infarction ([place]) 2014  . OSA on CPAP   . Testicular hypofunction   . Umbilical hernia     Patient Active Problem List   Diagnosis Date Noted  . NSTEMI (non-ST elevated myocardial infarction) ([place]) 07/28/2020  . Sleep apnea 04/26/2020    Past Surgical History:  Procedure Laterality Date  . BARIATRIC SURGERY  07/13/2013  . BIOPSY  07/19/2020   Procedure: BIOPSY;  Surgeon: Eloise Harman, DO;  Location: [facility];  Service: Endoscopy;;  . COLONOSCOPY WITH PROPOFOL N/A 07/19/2020   Procedure: COLONOSCOPY WITH PROPOFOL;  Surgeon: Eloise Harman, DO;  Location: [facility];  Service: Endoscopy;  Laterality: N/A;  early am appt  . ESOPHAGOGASTRODUODENOSCOPY (EGD) WITH PROPOFOL N/A 07/19/2020   Procedure: ESOPHAGOGASTRODUODENOSCOPY (EGD) WITH PROPOFOL;  Surgeon: Eloise Harman, DO;  Location: [facility];  Service: Endoscopy;  Laterality: N/A;  . HIATAL HERNIA REPAIR    . KNEE SURGERY Left 2015   Due to meniscal tear  . NASAL SEPTUM SURGERY  03/2019  . POLYPECTOMY  07/19/2020   Procedure: POLYPECTOMY;  Surgeon: Eloise Harman, DO;  Location: [facility];  Service: Endoscopy;;       Family History  Problem Relation Age of Onset  . COPD Mother   .  Autism Son   . Skin cancer Maternal Grandmother   . Heart attack Maternal Grandfather     Social History   Tobacco Use  . Smoking status: Former Smoker    Packs/day: 1.00    Types: Cigarettes    Quit date: 02/04/2014    Years since quitting: 6.4  . Smokeless tobacco: Never Used  Vaping Use  . Vaping Use: Never used  Substance Use Topics  . Alcohol use: Yes    Comment: very rarely   . Drug use: Never    Home Medications Prior to Admission medications   Medication Sig Start Date End Date Taking? Authorizing Provider  Bempedoic Acid-Ezetimibe (NEXLIZET) 180-10 MG TABS Take 1 tablet by mouth daily. (NEEDS TO BE SEEN BEFORE NEXT REFILL) 07/25/20  Yes Hendricks Limes F, FNP  Famotidine (PEPCID PO) Take 20 mg by mouth at bedtime.   Yes [provider]  lisinopril (ZESTRIL) 10 MG tablet Take 1 tablet (10 mg total) by mouth daily. (NEEDS TO BE SEEN BEFORE NEXT REFILL) 07/25/20  Yes Hendricks Limes F, FNP  loperamide (IMODIUM) 2 MG capsule Take 6-8 mg by mouth daily as needed for diarrhea or loose stools.   Yes [provider]  metFORMIN (GLUCOPHAGE-XR) 500 MG 24 hr tablet Take 2 tablets (1,000 mg total) by mouth daily with breakfast AND 1 tablet (500 mg total) daily with supper. 02/10/20  Yes Loman Brooklyn, FNP  metoprolol tartrate (LOPRESSOR) 25 MG tablet Take 1 tablet (25 mg total) by mouth 2 (two) times daily. 04/19/20  Yes Ronnie Doss M, DO  indomethacin (INDOCIN) 25 MG capsule Take 25 mg by mouth 2 (two) times daily. 06/30/20   [provider]  omeprazole (PRILOSEC) 20 MG capsule Take 1 capsule (20 mg total) by mouth 2 (two) times daily before a meal. Patient not taking: No sig reported 07/19/20 01/15/21  Eloise Harman, DO    Allergies    Gramineae pollens and Statins  Review of Systems   Review of Systems  Constitutional: Negative for chills, diaphoresis, fatigue and fever.  HENT: Negative for congestion, sore throat and trouble swallowing.   Eyes: Negative for pain and visual disturbance.  Respiratory: Negative for cough, shortness of breath and wheezing.   Cardiovascular: Positive for chest pain. Negative for palpitations and leg swelling.  Gastrointestinal: Negative for abdominal distention, abdominal pain, diarrhea, nausea and vomiting.  Genitourinary: Negative for difficulty urinating.  Musculoskeletal: Negative for back pain, neck pain and neck stiffness.  Skin: Negative for pallor.  Neurological: Negative for dizziness, speech difficulty, weakness and headaches.  Psychiatric/Behavioral: Negative for confusion. The patient is nervous/anxious.     Physical Exam Updated Vital Signs BP 137/82   Pulse  (!) 56   Temp 98.2 F (36.8 C) (Oral)   Resp 10   Ht 5\' 11"  (1.803 m)   Wt 133.8 kg   SpO2 98%   BMI 41.14 kg/m   Physical Exam Constitutional:      General: He is not in acute distress.    Appearance: Normal appearance. He is not ill-appearing, toxic-appearing or diaphoretic.  HENT:     Mouth/Throat:     Mouth: Mucous membranes are moist.     Pharynx: Oropharynx is clear.  Eyes:     General: No scleral icterus.    Extraocular Movements: Extraocular movements intact.     Pupils: Pupils are equal, round, and reactive to light.  Cardiovascular:     Rate and Rhythm: Normal rate and regular rhythm.  Pulses: Normal pulses.     Heart sounds: Normal heart sounds.  Pulmonary:     Effort: Pulmonary effort is normal. No respiratory distress.     Breath sounds: Normal breath sounds. No stridor. No wheezing, rhonchi or rales.  Chest:     Chest wall: No tenderness.  Abdominal:     General: Abdomen is flat. There is no distension.     Palpations: Abdomen is soft.     Tenderness: There is no abdominal tenderness. There is no guarding or rebound.  Musculoskeletal:        General: No swelling or tenderness. Normal range of motion.     Cervical back: Normal range of motion and neck supple. No rigidity.     Right lower leg: No edema.     Left lower leg: No edema.  Skin:    General: Skin is warm and dry.     Capillary Refill: Capillary refill takes less than 2 seconds.     Coloration: Skin is not pale.  Neurological:     General: No focal deficit present.     Mental Status: He is alert and oriented to person, place, and time.     Cranial Nerves: No cranial nerve deficit.     Motor: No weakness.  Psychiatric:        Mood and Affect: Mood is anxious.        Behavior: Behavior normal.     ED Results / Procedures / Treatments   Labs (all labs ordered are listed, but only abnormal results are displayed) Labs Reviewed  COMPREHENSIVE METABOLIC PANEL - Abnormal; Notable for the  following components:      Result Value   Sodium 132 (*)    Glucose, Bld 283 (*)    Calcium 8.8 (*)    ALT 51 (*)    All other components within normal limits  TROPONIN I (HIGH SENSITIVITY) - Abnormal; Notable for the following components:   Troponin I (High Sensitivity) 450 (*)    All other components within normal limits  RESP PANEL BY RT-PCR (FLU A&B, COVID) ARPGX2  CBC  HEPARIN LEVEL (UNFRACTIONATED)  TROPONIN I (HIGH SENSITIVITY)    EKG EKG Interpretation  Date/Time:  Thursday July 28 2020 10:34:59 EST Ventricular Rate:  60 PR Interval:    QRS Duration: 88 QT Interval:  383 QTC Calculation: 383 R Axis:   51 Text Interpretation: Sinus rhythm No significant change since last tracing Confirmed by Fredia Sorrow [phone]) on 07/28/2020 11:34:32 AM   Radiology DG Chest 2 View  Result Date: 07/28/2020 CLINICAL DATA:  45 year old male with chest pain onset this morning. Former smoker. EXAM: CHEST - 2 VIEW COMPARISON:  None. FINDINGS: Normal lung volumes and mediastinal contours. Visualized tracheal air column is within normal limits. Lung markings are within normal limits. No pneumothorax or pleural effusion. Degenerative endplate spurring in the thoracic spine. No acute osseous abnormality identified. Negative visible bowel gas. IMPRESSION: Negative.  No cardiopulmonary abnormality. Electronically Signed   By: Genevie Ann M.D.   On: 07/28/2020 11:22    Procedures .Critical Care Performed by: Alfredia Client, PA-C Authorized by: Alfredia Client, PA-C   Critical care provider statement:    Critical care time (minutes):  45   Critical care was necessary to treat or prevent imminent or life-threatening deterioration of the following conditions: NSTEMI.   Critical care was time spent personally by me on the following activities:  Discussions with consultants, evaluation of patient's response to treatment, examination of patient, ordering  and performing treatments and interventions,  ordering and review of laboratory studies, ordering and review of radiographic studies, pulse oximetry, re-evaluation of patient's condition, obtaining history from patient or surrogate and review of old charts     Medications Ordered in ED Medications  aspirin chewable tablet 324 mg (324 mg Oral Not Given 07/28/20 1156)  nitroGLYCERIN (NITROSTAT) SL tablet 0.4 mg (has no administration in time range)  heparin ADULT infusion 100 units/mL (25000 units/261mL) (1,100 Units/hr Intravenous New Bag/Given 07/28/20 1210)  0.9 %  sodium chloride infusion (has no administration in time range)  heparin bolus via infusion 4,000 Units (4,000 Units Intravenous Bolus from Bag 07/28/20 1208)    ED Course  I have reviewed the triage vital signs and the nursing notes.  Pertinent labs & imaging results that were available during my care of the patient were reviewed by me and considered in my medical decision making (see chart for details).    MDM Rules/Calculators/A&P                          Jimmy Tyler is a 45 y.o. male with past medical history of asthma, diabetes, GERD, history of NSTEMI 8 years ago that presents the emergency department today for evaluation of chest pain.  Patient does have significant cardiac history with NSTEMI 8 years ago, concerns for ACS, [place].  patient is not complaining of chest pain, however states that his left side feels abnormal.  Normal vital signs.  Patient is hemodynamically stable.  EKG without any ST elevations or depressions, interpreted by me.  Chest x-ray interpreted by me no acute cardiopulmonary disease.  Work-up today shows troponin of 450, patient most likely with NSTEMI.  Consulted cardiology, Dr. Domenic Polite who will admit patient.  Heparin initiated.  Patient has already taken 324 of aspirin today.  Upon repeat evaluation, discussed lab findings with patient, patient to be admitted, patient expressed understanding.  Remains stable.  The patient appears  reasonably stabilized for admission considering the current resources, flow, and capabilities available in the ED at this time, and I doubt any other [facility] requiring further screening and/or treatment in the ED prior to admission.   Final Clinical Impression(s) / ED Diagnoses Final diagnoses:  NSTEMI (non-ST elevated myocardial infarction) [facility])    Rx / DC Orders ED Discharge Orders    None       Alfredia Client, PA-C 07/28/20 1308    Fredia Sorrow, MD 08/09/20 [phone]

## 2020-07-28 NOTE — ED Notes (Signed)
ED TO INPATIENT HANDOFF REPORT  ED Nurse Name and Phone #:   S Name/Age/Gender Jimmy Tyler 45 y.o. male Room/Bed: APA08/APA08  Code Status   Code Status: Not on file  Home/SNF/Other Home Patient oriented to: self, place, time and situation Is this baseline? Yes   Triage Complete: Triage complete  Chief Complaint NSTEMI (non-ST elevated myocardial infarction) [facility]) [I21.4]  Triage Note Pt c/o intermittent left sided chest pain and lightheadedness x several days. Pt went to Urgent Care today and then sent him to ED for further evaluation. Denies SOB, nausea.    Allergies Allergies  Allergen Reactions  . Gramineae Pollens   . Statins Other (See Comments)    Body aches.  Failed 3 different ones.    Level of Care/Admitting Diagnosis ED Disposition    ED Disposition Condition Comment   Admit  Hospital Area: [place] [[id number]]  Level of Care: Telemetry Cardiac [[id number]]  Covid Evaluation: Asymptomatic Screening Protocol (No Symptoms)  Diagnosis: NSTEMI (non-ST elevated myocardial infarction) [facility]) [[id number]]  Admitting Physician: Deneen Harts  Attending Physician: MCDOWELL, Hilbert Odor       B Medical/Surgery History Past Medical History:  Diagnosis Date  . Allergy   . Asthma   . Diabetes mellitus without complication ([place])   . GERD (gastroesophageal reflux disease)   . History of Clostridium difficile infection   . History of non-ST elevation myocardial infarction (NSTEMI)   . Hyperlipidemia   . Hypertension   . Myocardial infarction ([place]) 2014  . OSA on CPAP   . Testicular hypofunction   . Umbilical hernia    Past Surgical History:  Procedure Laterality Date  . BARIATRIC SURGERY  07/13/2013  . BIOPSY  07/19/2020   Procedure: BIOPSY;  Surgeon: Eloise Harman, DO;  [facility];  Service: Endoscopy;;  . COLONOSCOPY WITH PROPOFOL N/A 07/19/2020   Procedure: COLONOSCOPY WITH PROPOFOL;  Surgeon: Eloise Harman, DO;  [facility];  Service: Endoscopy;  Laterality: N/A;  early am appt  . ESOPHAGOGASTRODUODENOSCOPY (EGD) WITH PROPOFOL N/A 07/19/2020   Procedure: ESOPHAGOGASTRODUODENOSCOPY (EGD) WITH PROPOFOL;  Surgeon: Eloise Harman, DO;  [facility];  Service: Endoscopy;  Laterality: N/A;  . HIATAL HERNIA REPAIR    . KNEE SURGERY Left 2015   Due to meniscal tear  . NASAL SEPTUM SURGERY  03/2019  . POLYPECTOMY  07/19/2020   Procedure: POLYPECTOMY;  Surgeon: Eloise Harman, DO;  [facility];  Service: Endoscopy;;     A IV Location/Drains/Wounds Patient Lines/Drains/Airways Status    Active Line/Drains/Airways    Name Placement date Placement time Site Days   Peripheral IV 07/28/20 Right Antecubital 07/28/20  1039  Antecubital  less than 1   Peripheral IV 07/28/20 Right Forearm 07/28/20  1328  Forearm  less than 1          Intake/Output Last 24 hours No intake or output data in the 24 hours ending 07/28/20 1455  Labs/Imaging Results for orders placed or performed during the hospital encounter of 07/28/20 (from the past 48 hour(s))  CBC     Status: None   Collection Time: 07/28/20 10:33 AM  Result Value Ref Range   WBC 5.3 4.0 - 10.5 K/uL   RBC 4.27 4.22 - 5.81 MIL/uL   Hemoglobin 13.5 13.0 - 17.0 g/dL   HCT 39.5 39.0 - 52.0 %   MCV 92.5 80.0 - 100.0 fL   MCH 31.6 26.0 - 34.0  pg   MCHC 34.2 30.0 - 36.0 g/dL   RDW 13.1 11.5 - 15.5 %   Platelets 225 150 - 400 K/uL   nRBC 0.0 0.0 - 0.2 %    Comment: Performed at [facility], [address], [place], [place] [address]  Troponin I (High Sensitivity)     Status: Abnormal   Collection Time: 07/28/20 10:33 AM  Result Value Ref Range   Troponin I (High Sensitivity) 450 (HH) <18 ng/L    Comment: CRITICAL RESULT CALLED TO, READ BACK BY AND VERIFIED WITH: WHITE,M 1123 [id number]/2022 COLEMAN,R (NOTE) Elevated high sensitivity troponin I (hsTnI) values and significant  changes across serial measurements may  suggest ACS but many other  chronic and acute conditions are known to elevate hsTnI results.  Refer to the Links section for chest pain algorithms and additional  guidance. Performed at [facility], [address], [place], [place] [address]   Comprehensive metabolic panel     Status: Abnormal   Collection Time: 07/28/20 10:33 AM  Result Value Ref Range   Sodium 132 (L) 135 - 145 mmol/L   Potassium 4.2 3.5 - 5.1 mmol/L   Chloride 99 98 - 111 mmol/L   CO2 24 22 - 32 mmol/L   Glucose, Bld 283 (H) 70 - 99 mg/dL    Comment: Glucose reference range applies only to samples taken after fasting for at least 8 hours.   BUN 13 6 - 20 mg/dL   Creatinine, Ser 0.82 0.61 - 1.24 mg/dL   Calcium 8.8 (L) 8.9 - 10.3 mg/dL   Total Protein 7.2 6.5 - 8.1 g/dL   Albumin 3.8 3.5 - 5.0 g/dL   AST 30 15 - 41 U/L   ALT 51 (H) 0 - 44 U/L   Alkaline Phosphatase 52 38 - 126 U/L   Total Bilirubin 0.4 0.3 - 1.2 mg/dL   GFR, Estimated >60 >60 mL/min    Comment: (NOTE) Calculated using the CKD-EPI Creatinine Equation (2021)    Anion gap 9 5 - 15    Comment: Performed at [facility], [address], [place], [place] [address]  Resp Panel by RT-PCR (Flu A&B, Covid) Nasopharyngeal Swab     Status: None   Collection Time: 07/28/20 12:13 PM   Specimen: Nasopharyngeal Swab; Nasopharyngeal(NP) swabs in vial transport medium  Result Value Ref Range   SARS Coronavirus 2 by RT PCR NEGATIVE NEGATIVE    Comment: (NOTE) SARS-CoV-2 target nucleic acids are NOT DETECTED.  The SARS-CoV-2 RNA is generally detectable in upper respiratory specimens during the acute phase of infection. The lowest concentration of SARS-CoV-2 viral copies this assay can detect is 138 copies/mL. A negative result does not preclude SARS-Cov-2 infection and should not be used as the sole basis for treatment or other patient management decisions. A negative result may occur with  improper specimen collection/handling, submission of specimen  other than nasopharyngeal swab, presence of viral mutation(s) within the areas targeted by this assay, and inadequate number of viral copies(<138 copies/mL). A negative result must be combined with clinical observations, patient history, and epidemiological information. The expected result is Negative.  Fact Sheet for Patients:  EntrepreneurPulse.com.au  Fact Sheet for Healthcare Providers:  IncredibleEmployment.be  This test is no t yet approved or cleared by the [place] FDA and  has been authorized for detection and/or diagnosis of SARS-CoV-2 by FDA under an Emergency Use Authorization (EUA). This EUA will remain  in effect (meaning this test can be used) for the duration of  the COVID-19 declaration under Section 564(b)(1) of the Act, 21 U.S.C.section 360bbb-3(b)(1), unless the authorization is terminated  or revoked sooner.       Influenza A by PCR NEGATIVE NEGATIVE   Influenza B by PCR NEGATIVE NEGATIVE    Comment: (NOTE) The Xpert Xpress SARS-CoV-2/FLU/RSV plus assay is intended as an aid in the diagnosis of influenza from Nasopharyngeal swab specimens and should not be used as a sole basis for treatment. Nasal washings and aspirates are unacceptable for Xpert Xpress SARS-CoV-2/FLU/RSV testing.  Fact Sheet for Patients: EntrepreneurPulse.com.au  Fact Sheet for Healthcare Providers: IncredibleEmployment.be  This test is not yet approved or cleared by the [place] FDA and has been authorized for detection and/or diagnosis of SARS-CoV-2 by FDA under an Emergency Use Authorization (EUA). This EUA will remain in effect (meaning this test can be used) for the duration of the COVID-19 declaration under Section 564(b)(1) of the Act, 21 U.S.C. section 360bbb-3(b)(1), unless the authorization is terminated or revoked.  Performed at [facility], [address], [place], [place] [address]    DG  Chest 2 View  Result Date: 07/28/2020 CLINICAL DATA:  45 year old male with chest pain onset this morning. Former smoker. EXAM: CHEST - 2 VIEW COMPARISON:  None. FINDINGS: Normal lung volumes and mediastinal contours. Visualized tracheal air column is within normal limits. Lung markings are within normal limits. No pneumothorax or pleural effusion. Degenerative endplate spurring in the thoracic spine. No acute osseous abnormality identified. Negative visible bowel gas. IMPRESSION: Negative.  No cardiopulmonary abnormality. Electronically Signed   By: Genevie Ann M.D.   On: 07/28/2020 11:22    Pending Labs Unresulted Labs (From admission, onward)          Start     Ordered   07/29/20 0500  Heparin level (unfractionated)  Daily,   R      07/28/20 1235   07/29/20 0500  CBC  Daily,   R      07/28/20 1235   07/28/20 1800  Heparin level (unfractionated)  Once-Timed,   STAT        07/28/20 1151   Signed and Held  Hemoglobin A1c  Once,   R       Comments: To assess prior glycemic control    Signed and Held   Signed and Held  HIV Antibody (routine testing w rflx)  (HIV Antibody (Routine testing w reflex) panel)  Once,   R        Signed and Held   Signed and Held  Basic metabolic panel  Tomorrow morning,   R        Signed and Held   Signed and Held  Hemoglobin A1c  Add-on,   R        Signed and Held   Signed and Held  Lipid panel  Tomorrow morning,   R        Signed and Held          Vitals/Pain Today's Vitals   07/28/20 1031 07/28/20 1100 07/28/20 1130 07/28/20 1200  BP: (!) 144/75 138/80 133/76 137/82  Pulse: 60 62 (!) 58 (!) 56  Resp: 20 17 (!) 24 10  Temp: 98.2 F (36.8 C)     TempSrc: Oral     SpO2: 98% 99% 98% 98%  Weight:      Height:      PainSc:        Isolation Precautions Airborne and Contact precautions  Medications Medications  aspirin chewable tablet 324 mg (  324 mg Oral Not Given 07/28/20 1156)  nitroGLYCERIN (NITROSTAT) SL tablet 0.4 mg (has no administration in  time range)  heparin ADULT infusion 100 units/mL (25000 units/244mL) (1,100 Units/hr Intravenous New Bag/Given 07/28/20 1210)  0.9 %  sodium chloride infusion ( Intravenous New Bag/Given 07/28/20 1329)  heparin bolus via infusion 4,000 Units (4,000 Units Intravenous Bolus from Bag 07/28/20 1208)    Mobility walks Low fall risk   Focused Assessments    R Recommendations: See Admitting Provider Note  Report given to:   Additional Notes:

## 2020-07-28 NOTE — H&P (Addendum)
Cardiology Admission History and Physical:   Patient ID: Jimmy Tyler MRN: [id number]; DOB: November 24, 1975   Admission date: 07/28/2020  PCP:  Loman Brooklyn, [place]  Cardiologist: New to [facility] - Dr. Domenic Polite  Chief Complaint: Chest Pain  Patient Profile:   Jimmy Tyler is a 45 y.o. male with past medical history of CAD (reported NSTEMI 8+ years ago with normal cors by cath at that time per his report), HTN, HLD,Type 2 DM and GERD who is being seen today for evaluation of chest pain at the request of Dr. Rogene Houston.   History of Present Illness:   Jimmy Tyler presented to [facility] this morning for evaluation of chest pain which has been occurring for the past several days. He reports being under increased stress over the past few months as his father-in-law passed away from [place] and his wife has been suffering significant grief and this has led to her asking for a divorce at times. He reports a history of esophageal spasms which were mostly right-sided but since being started on PPI therapy several years ago, symptoms have improved. Approximately 3 to 4 days ago, he noticed discomfort along his left side which is more of a shooting discomfort but did radiate into his left arm. He tried taking sublingual nitroglycerin with no improvement in his symptoms. He continued to experience intermittent symptoms off and on over the past few days which prompted him to go to urgent care for evaluation where he was subsequently sent to the ED.  He denies any associated dyspnea, orthopnea, PND or lower extremity edema. Says that he just feels "off" and can tell that something was wrong. 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   Says that he suffered an MI approximately 8+ years ago in [place] and was informed his cardiac enzymes were  elevated at that time but catheterization revealed no significant blockage and he did not require PCI or stent placement.  Initial labs show WBC 5.3, Hgb 13.5, platelets 225, Na+ 132, K+ 4.2, glucose 283 and creatinine 0.82. Initial HS Troponin elevated to 450. CXR with no acute abnormalities. EKG shows NSR, HR 60 with no acute ST abnormalities when compared to prior tracings.   He denies any pain at this current time.  He does question if he could go home and return to the hospital as his dogs are in the house and his son gets out of school in the next few hours.    Past Medical History:  Diagnosis Date  . Allergy   . Asthma   . Diabetes mellitus without complication ([place])   . GERD (gastroesophageal reflux disease)   . History of Clostridium difficile infection   . History of non-ST elevation myocardial infarction (NSTEMI)   . Hyperlipidemia   . Hypertension   . Myocardial infarction ([place]) 2014  . OSA on CPAP   . Testicular hypofunction   . Umbilical hernia     Past Surgical History:  Procedure Laterality Date  . BARIATRIC SURGERY  07/13/2013  . BIOPSY  07/19/2020   Procedure: BIOPSY;  Surgeon: Eloise Harman, DO;  [facility];  Service: Endoscopy;;  . COLONOSCOPY WITH PROPOFOL N/A 07/19/2020   Procedure: COLONOSCOPY WITH PROPOFOL;  Surgeon: Eloise Harman, DO;  [facility];  Service: Endoscopy;  Laterality: N/A;  early  am appt  . ESOPHAGOGASTRODUODENOSCOPY (EGD) WITH PROPOFOL N/A 07/19/2020   Procedure: ESOPHAGOGASTRODUODENOSCOPY (EGD) WITH PROPOFOL;  Surgeon: Eloise Harman, DO;  [facility];  Service: Endoscopy;  Laterality: N/A;  . HIATAL HERNIA REPAIR    . KNEE SURGERY Left 2015   Due to meniscal tear  . NASAL SEPTUM SURGERY  03/2019  . POLYPECTOMY  07/19/2020   Procedure: POLYPECTOMY;  Surgeon: Eloise Harman, DO;  [facility];  Service: Endoscopy;;     Medications Prior to Admission: Prior to Admission medications    Medication Sig Start Date End Date Taking? Authorizing Provider  Bempedoic Acid-Ezetimibe (NEXLIZET) 180-10 MG TABS Take 1 tablet by mouth daily. (NEEDS TO BE SEEN BEFORE NEXT REFILL) 07/25/20  Yes Hendricks Limes F, FNP  Famotidine (PEPCID PO) Take 20 mg by mouth at bedtime.   Yes [provider]  lisinopril (ZESTRIL) 10 MG tablet Take 1 tablet (10 mg total) by mouth daily. (NEEDS TO BE SEEN BEFORE NEXT REFILL) 07/25/20  Yes Hendricks Limes F, FNP  loperamide (IMODIUM) 2 MG capsule Take 6-8 mg by mouth daily as needed for diarrhea or loose stools.   Yes [provider]  metFORMIN (GLUCOPHAGE-XR) 500 MG 24 hr tablet Take 2 tablets (1,000 mg total) by mouth daily with breakfast AND 1 tablet (500 mg total) daily with supper. 02/10/20  Yes Loman Brooklyn, FNP  metoprolol tartrate (LOPRESSOR) 25 MG tablet Take 1 tablet (25 mg total) by mouth 2 (two) times daily. 04/19/20  Yes Ronnie Doss M, DO  indomethacin (INDOCIN) 25 MG capsule Take 25 mg by mouth 2 (two) times daily. 06/30/20   [provider]  omeprazole (PRILOSEC) 20 MG capsule Take 1 capsule (20 mg total) by mouth 2 (two) times daily before a meal. Patient not taking: No sig reported 07/19/20 01/15/21  Eloise Harman, DO     Allergies:    Allergies  Allergen Reactions  . Gramineae Pollens   . Statins Other (See Comments)    Body aches.  Failed 3 different ones.    Social History:   Social History   Socioeconomic History  . Marital status: Married    Spouse name: Not on file  . Number of children: Not on file  . Years of education: Not on file  . Highest education level: Not on file  Occupational History  . Not on file  Tobacco Use  . Smoking status: Former Smoker    Packs/day: 1.00    Types: Cigarettes    Quit date: 02/04/2014    Years since quitting: 6.4  . Smokeless tobacco: Never Used  Vaping Use  . Vaping Use: Never used  Substance and Sexual Activity  . Alcohol use: Yes    Comment: very  rarely   . Drug use: Never  . Sexual activity: Yes    Birth control/protection: None  Other Topics Concern  . Not on file  Social History Narrative  . Not on file   Social Determinants of Health   Financial Resource Strain: Not on file  Food Insecurity: Not on file  Transportation Needs: Not on file  Physical Activity: Not on file  Stress: Not on file  Social Connections: Not on file  Intimate Partner Violence: Not on file    Family History:   The patient's family history includes Autism in his son; COPD in his mother; Heart attack in his maternal grandfather; Skin cancer in his maternal grandmother.    ROS:  Please see  the history of present illness.  All other ROS reviewed and negative.     Physical Exam/Data:   Vitals:   07/28/20 1031 07/28/20 1100 07/28/20 1130 07/28/20 1200  BP: (!) 144/75 138/80 133/76 137/82  Pulse: 60 62 (!) 58 (!) 56  Resp: 20 17 (!) 24 10  Temp: 98.2 F (36.8 C)     TempSrc: Oral     SpO2: 98% 99% 98% 98%  Weight:      Height:       No intake or output data in the 24 hours ending 07/28/20 1252 Last 3 Weights 07/28/2020 07/15/2020 06/28/2020  Weight (lbs) 295 lb 295 lb 294 lb 5 oz  Weight (kg) 133.811 kg 133.811 kg 133.499 kg     Body mass index is 41.14 kg/m.  General:  Well nourished, well developed appearing in no acute distress HEENT: normal Lymph: no adenopathy Neck: no JVD Endocrine:  No thryomegaly Vascular: No carotid bruits; FA pulses 2+ bilaterally without bruits  Cardiac:  normal S1, S2; RRR; no murmur  Lungs:  clear to auscultation bilaterally, no wheezing, rhonchi or rales  Abd: soft, nontender, no hepatomegaly  Ext: no edema Musculoskeletal:  No deformities, BUE and BLE strength normal and equal Skin: warm and dry  Neuro:  CNs 2-12 intact, no focal abnormalities noted Psych:  Normal affect    EKG:  The ECG that was done was personally reviewed and demonstrates NSR, HR 60 with no acute ST abnormalities when compared to  prior tracings.    Relevant CV Studies:  None on File  Laboratory Data:  High Sensitivity Troponin:   Recent Labs  Lab 07/28/20 1033  TROPONINIHS 450*      Chemistry Recent Labs  Lab 07/28/20 1033  NA 132*  K 4.2  CL 99  CO2 24  GLUCOSE 283*  BUN 13  CREATININE 0.82  CALCIUM 8.8*  GFRNONAA >60  ANIONGAP 9    Recent Labs  Lab 07/28/20 1033  PROT 7.2  ALBUMIN 3.8  AST 30  ALT 51*  ALKPHOS 52  BILITOT 0.4   Hematology Recent Labs  Lab 07/28/20 1033  WBC 5.3  RBC 4.27  HGB 13.5  HCT 39.5  MCV 92.5  MCH 31.6  MCHC 34.2  RDW 13.1  PLT 225   BNPNo results for input(s): BNP, PROBNP in the last 168 hours.  DDimer No results for input(s): DDIMER in the last 168 hours.   Radiology/Studies:  DG Chest 2 View  Result Date: 07/28/2020 CLINICAL DATA:  45 year old male with chest pain onset this morning. Former smoker. EXAM: CHEST - 2 VIEW COMPARISON:  None. FINDINGS: Normal lung volumes and mediastinal contours. Visualized tracheal air column is within normal limits. Lung markings are within normal limits. No pneumothorax or pleural effusion. Degenerative endplate spurring in the thoracic spine. No acute osseous abnormality identified. Negative visible bowel gas. IMPRESSION: Negative.  No cardiopulmonary abnormality. Electronically Signed   By: Genevie Ann M.D.   On: 07/28/2020 11:22     Assessment and Plan:   1. NSTEMI - Presents with a 3-4 day history of intermittent chest pain which can occur at rest or with activity and feels different from his prior esophageal spasms. He does report "feeling off" during this timeframe as well. He did have an NSTEMI 8+ years ago in [place] and was informed his cardiac enzymes were elevated at that time but catheterization revealed no significant blockage and he did not require PCI or stent placement. - Initial HS Troponin  elevated to 450 with repeat values pending. Will continue to cycle enzymes. Check FLP and Hgb A1c. Will also  check an echocardiogram for assessment of LV function and wall motion.  - Reviewed with Dr. Domenic Polite and the patient and will plan for a cardiac catheterization for definitive evaluation. The patient understands that risks include but are not limited to stroke (1 in 1000), death (1 in 69), kidney failure [usually temporary] (1 in 500), bleeding (1 in 200), allergic reaction [possibly serious] (1 in 200).  Added to the cath board for later today (last food consumption was a muffin before 1000). Continue Heparin along with ASA, Lopressor and Nexlizet (statin intolerant). He did question if he could go home and return and was informed he should remain admitted at this time given his NSTEMI and is in agreement for staying. He is making arrangements with other family members to take care of his son and dogs.   2.  HTN - BP has overall been well controlled while in the ED, at 137/82 on most recent check. Continue Lopressor 25 mg twice daily. Will hold PTA Lisinopril 10 mg daily until following his catheterization.  3. HLD - Will recheck FLP. Continue PTA Nexlizet.   4. Type 2 DM - On Metformin prior to admission. Will hold in anticipation of his cardiac catheterization. SSI ordered.    Risk Assessment/Risk Scores:     TIMI Risk Score for Unstable Angina or Non-ST Elevation MI:   The patient's TIMI risk score is 4, which indicates a 20% risk of all cause mortality, new or recurrent myocardial infarction or need for urgent revascularization in the next 14 days.{   For questions or updates, please contact [place] Please consult [email or web address] for contact info under     Signed, Erma Heritage, PA-C  07/28/2020 12:52 PM    Attending note:  Patient seen and examined.  Case discussed with Ms. Ahmed Prima PA-C, I agree with her above findings.  Mr. Garringer presents with recent onset left-sided chest discomfort radiating to the left arm, initially occurred about 3 days ago and has since lessened  and only intermittently present, but not completely returned to baseline.  He has a history of apparent NSTEMI approximately 8 years ago associated with no major CAD by cardiac catheterization at a facility in [place].  At baseline he has hypertension, hyperlipidemia, and type 2 diabetes mellitus.  He has been under a significant amount of psychosocial stress recently as discussed in the HPI above.  He did take sublingual nitroglycerin with the recent onset chest discomfort, but did not lead to any significant improvement.  On examination he is in no distress, states that he just feels "off."  He is afebrile, heart rate in the 60s in sinus rhythm by telemetry which I personally reviewed.  Systolic blood pressure in the 130s.  Lungs are clear without labored breathing..  Cardiac exam reveals RRR without gallop.  Pertinent lab work includes potassium 4.2, creatinine 0.82, AST 30, ALT 51, high-sensitivity troponin I 450, hemoglobin 13.5, platelets 225, glucose 283.  Chest x-ray reports no acute process.  I personally reviewed his ECG which shows normal sinus rhythm.  Patient presents with recent onset left-sided chest discomfort radiating to left arm concerning for unstable angina in the setting of hypertension, hyperlipidemia, and type 2 diabetes mellitus at baseline.  He reports apparent history of nonobstructive CAD with NSTEMI 8 years ago at a facility in [place], records not available at this time.  No PCI.  He  has been under a significant amount of psychosocial stress recently as discussed in HPI above.  High-sensitivity troponin I 450 at presentation.  Risks and benefits of diagnostic cardiac catheterization discussed with the patient and he is in agreement to proceed.  Transport being arranged to [facility].  Satira Sark, M.D., F.A.C.C.

## 2020-07-28 NOTE — ED Notes (Addendum)
Per lab the pts. Troponin will not result for another due to needing to re-calibrate the machine.

## 2020-07-28 NOTE — Progress Notes (Signed)
*  PRELIMINARY RESULTS* Echocardiogram 2D Echocardiogram has been performed.  Jimmy Tyler 07/28/2020, 2:58 PM

## 2020-07-28 NOTE — ED Triage Notes (Signed)
Pt c/o intermittent left sided chest pain and lightheadedness x several days. Pt went to Urgent Care today and then sent him to ED for further evaluation. Denies SOB, nausea.

## 2020-07-29 ENCOUNTER — Encounter (HOSPITAL_COMMUNITY): Payer: Self-pay | Admitting: Cardiology

## 2020-07-29 DIAGNOSIS — I739 Peripheral vascular disease, unspecified: Secondary | ICD-10-CM

## 2020-07-29 DIAGNOSIS — Z7982 Long term (current) use of aspirin: Secondary | ICD-10-CM | POA: Diagnosis not present

## 2020-07-29 DIAGNOSIS — E119 Type 2 diabetes mellitus without complications: Secondary | ICD-10-CM | POA: Diagnosis not present

## 2020-07-29 DIAGNOSIS — R079 Chest pain, unspecified: Secondary | ICD-10-CM | POA: Diagnosis not present

## 2020-07-29 DIAGNOSIS — E1165 Type 2 diabetes mellitus with hyperglycemia: Secondary | ICD-10-CM

## 2020-07-29 DIAGNOSIS — Z79899 Other long term (current) drug therapy: Secondary | ICD-10-CM | POA: Diagnosis not present

## 2020-07-29 DIAGNOSIS — Z20822 Contact with and (suspected) exposure to covid-19: Secondary | ICD-10-CM | POA: Diagnosis not present

## 2020-07-29 DIAGNOSIS — Z87891 Personal history of nicotine dependence: Secondary | ICD-10-CM | POA: Diagnosis not present

## 2020-07-29 DIAGNOSIS — J45909 Unspecified asthma, uncomplicated: Secondary | ICD-10-CM | POA: Diagnosis not present

## 2020-07-29 DIAGNOSIS — Z7984 Long term (current) use of oral hypoglycemic drugs: Secondary | ICD-10-CM | POA: Diagnosis not present

## 2020-07-29 DIAGNOSIS — E782 Mixed hyperlipidemia: Secondary | ICD-10-CM

## 2020-07-29 DIAGNOSIS — I10 Essential (primary) hypertension: Secondary | ICD-10-CM | POA: Diagnosis not present

## 2020-07-29 DIAGNOSIS — E785 Hyperlipidemia, unspecified: Secondary | ICD-10-CM

## 2020-07-29 DIAGNOSIS — R778 Other specified abnormalities of plasma proteins: Secondary | ICD-10-CM | POA: Diagnosis not present

## 2020-07-29 DIAGNOSIS — I2510 Atherosclerotic heart disease of native coronary artery without angina pectoris: Secondary | ICD-10-CM | POA: Diagnosis not present

## 2020-07-29 LAB — CBC
HCT: 39.1 % (ref 39.0–52.0)
Hemoglobin: 13.2 g/dL (ref 13.0–17.0)
MCH: 30.8 pg (ref 26.0–34.0)
MCHC: 33.8 g/dL (ref 30.0–36.0)
MCV: 91.1 fL (ref 80.0–100.0)
Platelets: 210 10*3/uL (ref 150–400)
RBC: 4.29 MIL/uL (ref 4.22–5.81)
RDW: 13.2 % (ref 11.5–15.5)
WBC: 5.5 10*3/uL (ref 4.0–10.5)
nRBC: 0 % (ref 0.0–0.2)

## 2020-07-29 LAB — BASIC METABOLIC PANEL
Anion gap: 6 (ref 5–15)
BUN: 12 mg/dL (ref 6–20)
CO2: 28 mmol/L (ref 22–32)
Calcium: 8.9 mg/dL (ref 8.9–10.3)
Chloride: 102 mmol/L (ref 98–111)
Creatinine, Ser: 0.83 mg/dL (ref 0.61–1.24)
GFR, Estimated: >60 mL/min (ref >60)
Glucose, Bld: 150 mg/dL — ABNORMAL HIGH (ref 70–99)
Potassium: 4 mmol/L (ref 3.5–5.1)
Sodium: 136 mmol/L (ref 135–145)

## 2020-07-29 LAB — GLUCOSE, CAPILLARY
Glucose-Capillary: 200 mg/dL — ABNORMAL HIGH (ref 70–99)
Glucose-Capillary: 137 mg/dL — ABNORMAL HIGH (ref 70–99)

## 2020-07-29 LAB — LIPID PANEL
Cholesterol: 168 mg/dL (ref 0–200)
HDL: 36 mg/dL — ABNORMAL LOW (ref >40)
LDL Cholesterol: 81 mg/dL (ref 0–99)
Total CHOL/HDL Ratio: 4.7 RATIO
Triglycerides: 254 mg/dL — ABNORMAL HIGH (ref <150)
VLDL: 51 mg/dL — ABNORMAL HIGH (ref 0–40)

## 2020-07-29 MED ORDER — OMEGA-3-ACID ETHYL ESTERS 1 G PO CAPS
1.0000 g | ORAL_CAPSULE | Freq: Two times a day (BID) | ORAL | Status: DC
  Administered 2020-07-29: 1 g via ORAL
  Filled 2020-07-29: qty 1

## 2020-07-29 MED ORDER — ASPIRIN 81 MG PO TBEC: 81.0000 mg | DELAYED_RELEASE_TABLET | Freq: Every day | ORAL | 11 refills | Status: DC

## 2020-07-29 MED ORDER — OMEGA-3-ACID ETHYL ESTERS 1 G PO CAPS: 1.0000 g | ORAL_CAPSULE | Freq: Two times a day (BID) | ORAL | 2 refills | Status: DC

## 2020-07-29 MED ORDER — AMLODIPINE BESYLATE 5 MG PO TABS
5.0000 mg | ORAL_TABLET | Freq: Every day | ORAL | Status: DC
  Administered 2020-07-29: 5 mg via ORAL
  Filled 2020-07-29: qty 1

## 2020-07-29 MED ORDER — AMLODIPINE BESYLATE 5 MG PO TABS: 5.0000 mg | ORAL_TABLET | Freq: Every day | ORAL | 3 refills | Status: DC

## 2020-07-29 NOTE — Progress Notes (Addendum)
Progress Note  Patient Name: Jimmy Tyler Date of Encounter: 07/29/2020  Primary Cardiologist: Dr. Domenic Polite, MD   Subjective   Feels better today with no recurrent symptoms.   Inpatient Medications    Scheduled Meds: . aspirin  324 mg Oral Once  . aspirin EC  81 mg Oral Daily  . Bempedoic Acid-Ezetimibe  1 tablet Oral Daily  . insulin aspart  0-15 Units Subcutaneous TID WC  . metoprolol tartrate  25 mg Oral BID  . pantoprazole  40 mg Oral Daily  . sodium chloride flush  3 mL Intravenous Q12H   Continuous Infusions: . sodium chloride 125 mL/hr at 07/28/20 1329  . sodium chloride     PRN Meds: sodium chloride, acetaminophen, nitroGLYCERIN, ondansetron (ZOFRAN) IV, sodium chloride flush   Vital Signs    Vitals:   07/28/20 2111 07/29/20 0454 07/29/20 0858 07/29/20 0905  BP: 125/72 128/66 133/71 125/68  Pulse: 65 65 65 65  Resp:  18 19   Temp:  97.7 F (36.5 C) 98.4 F (36.9 C)   TempSrc:  Oral Oral   SpO2:  97% 96%   Weight:      Height:        Intake/Output Summary (Last 24 hours) at 07/29/2020 1103 Last data filed at 07/29/2020 0400 Gross per 24 hour  Intake 2178.41 ml  Output 500 ml  Net 1678.41 ml   Filed Weights   07/28/20 1028  Weight: 133.8 kg    Physical Exam   General: Well developed, well nourished, NAD Neck: Negative for carotid bruits. No JVD Lungs:Clear to ausculation bilaterally. No wheezes, rales, or rhonchi. Breathing is unlabored. Cardiovascular: RRR with S1 S2. No murmurs Abdomen: Soft, non-tender, non-distended. No obvious abdominal masses. Extremities: No edema. Radial pulses 2+ bilaterally Neuro: Alert and oriented. No focal deficits. No facial asymmetry. MAE spontaneously. Psych: Responds to questions appropriately with normal affect.    Labs    Chemistry Recent Labs  Lab 07/28/20 1033 07/29/20 0252  NA 132* 136  K 4.2 4.0  CL 99 102  CO2 24 28  GLUCOSE 283* 150*  BUN 13 12  CREATININE 0.82 0.83  CALCIUM 8.8*  8.9  PROT 7.2  --   ALBUMIN 3.8  --   AST 30  --   ALT 51*  --   ALKPHOS 52  --   BILITOT 0.4  --   GFRNONAA >60 >60  ANIONGAP 9 6     Hematology Recent Labs  Lab 07/28/20 1033 07/29/20 0252  WBC 5.3 5.5  RBC 4.27 4.29  HGB 13.5 13.2  HCT 39.5 39.1  MCV 92.5 91.1  MCH 31.6 30.8  MCHC 34.2 33.8  RDW 13.1 13.2  PLT 225 210    Cardiac EnzymesNo results for input(s): TROPONINI in the last 168 hours. No results for input(s): TROPIPOC in the last 168 hours.   BNPNo results for input(s): BNP, PROBNP in the last 168 hours.   DDimer No results for input(s): DDIMER in the last 168 hours.   Radiology    DG Chest 2 View  Result Date: 07/28/2020 CLINICAL DATA:  45 year old male with chest pain onset this morning. Former smoker. EXAM: CHEST - 2 VIEW COMPARISON:  None. FINDINGS: Normal lung volumes and mediastinal contours. Visualized tracheal air column is within normal limits. Lung markings are within normal limits. No pneumothorax or pleural effusion. Degenerative endplate spurring in the thoracic spine. No acute osseous abnormality identified. Negative visible bowel gas. IMPRESSION: Negative.  No  cardiopulmonary abnormality. Electronically Signed   By: Genevie Ann M.D.   On: 07/28/2020 11:22   CARDIAC CATHETERIZATION  Result Date: 07/28/2020  The left ventricular systolic function is normal.  LV end diastolic pressure is normal.  There is no aortic valve stenosis.  Angiographically normal coronary arteries  Normal cardiac catheterization-no culprit lesion to explain elevated troponin. Recommendation:  Standard post-cath care  Consider other imaging evaluation to determine potential etiology for elevated troponin such as myocarditis or pericarditis. Glenetta Hew, MD  ECHOCARDIOGRAM COMPLETE  Result Date: 07/28/2020    ECHOCARDIOGRAM REPORT   Patient Name:   Jimmy Tyler Date of Exam: 07/28/2020 Medical Rec #:  [id number]        Height:       71.0 in Accession #:    [id number]        Weight:       295.0 lb Date of Birth:  02-10-1976        BSA:          2.487 m Patient Age:    45 years         BP:           137/82 mmHg Patient Gender: M                HR:           56 bpm. Exam Location:  [facility] Procedure: 2D Echo, Cardiac Doppler and Color Doppler Indications:    NSTEMI l21.4  History:        Patient has no prior history of Echocardiogram examinations.                 Previous Myocardial Infarction; Risk Factors:Hypertension,                 Dyslipidemia and Diabetes. OSA on CPAP (From Hx), GERD.  Sonographer:    Alvino Chapel RCS Referring Phys: [id number] [place]  1. Left ventricular ejection fraction, by estimation, is 60 to 65%. The left ventricle has normal function. The left ventricle has no regional wall motion abnormalities. Left ventricular diastolic parameters were normal.  2. Right ventricular systolic function is normal. The right ventricular size is normal. There is normal pulmonary artery systolic pressure. The estimated right ventricular systolic pressure is 54.6 mmHg.  3. Left atrial size was upper normal.  4. The mitral valve is grossly normal. Trivial mitral valve regurgitation.  5. The aortic valve is tricuspid. Aortic valve regurgitation is not visualized.  6. The inferior vena cava is dilated in size with >50% respiratory variability, suggesting right atrial pressure of 8 mmHg. FINDINGS  Left Ventricle: Left ventricular ejection fraction, by estimation, is 60 to 65%. The left ventricle has normal function. The left ventricle has no regional wall motion abnormalities. The left ventricular internal cavity size was normal in size. There is  no left ventricular hypertrophy. Left ventricular diastolic parameters were normal. Right Ventricle: The right ventricular size is normal. No increase in right ventricular wall thickness. Right ventricular systolic function is normal. There is normal pulmonary artery systolic pressure. The tricuspid regurgitant  velocity is 2.63 m/s, and  with an assumed right atrial pressure of 8 mmHg, the estimated right ventricular systolic pressure is 27.0 mmHg. Left Atrium: Left atrial size was upper normal. Right Atrium: Right atrial size was normal in size. Pericardium: There is no evidence of pericardial effusion. Mitral Valve: The mitral valve is grossly normal. Trivial mitral valve regurgitation. Tricuspid Valve: The tricuspid  valve is grossly normal. Tricuspid valve regurgitation is trivial. Aortic Valve: The aortic valve is tricuspid. There is mild aortic valve annular calcification. Aortic valve regurgitation is not visualized. Pulmonic Valve: The pulmonic valve was grossly normal. Pulmonic valve regurgitation is trivial. Aorta: The aortic root is normal in size and structure. Venous: The inferior vena cava is dilated in size with greater than 50% respiratory variability, suggesting right atrial pressure of 8 mmHg. IAS/Shunts: No atrial level shunt detected by color flow Doppler.  LEFT VENTRICLE PLAX 2D LVIDd:         4.60 cm  Diastology LVIDs:         2.40 cm  LV e' medial:    13.10 cm/s LV PW:         0.90 cm  LV E/e' medial:  9.8 LV IVS:        0.90 cm  LV e' lateral:   11.40 cm/s LVOT diam:     1.80 cm  LV E/e' lateral: 11.3 LV SV:         83 LV SV Index:   33 LVOT Area:     2.54 cm  RIGHT VENTRICLE RV S prime:     14.60 cm/s TAPSE (M-mode): 3.1 cm LEFT ATRIUM             Index       RIGHT ATRIUM           Index LA diam:        4.10 cm 1.65 cm/m  RA Area:     15.90 cm LA Vol (A2C):   63.0 ml 25.33 ml/m RA Volume:   38.20 ml  15.36 ml/m LA Vol (A4C):   81.3 ml 32.69 ml/m LA Biplane Vol: 73.5 ml 29.55 ml/m  AORTIC VALVE LVOT Vmax:   165.00 cm/s LVOT Vmean:  105.000 cm/s LVOT VTI:    0.325 m  AORTA Ao Root diam: 2.80 cm MITRAL VALVE                TRICUSPID VALVE MV Area (PHT): 3.08 cm     TR Peak grad:   27.7 mmHg MV Decel Time: 246 msec     TR Vmax:        263.00 cm/s MV E velocity: 129.00 cm/s MV A velocity: 76.60  cm/s   SHUNTS MV E/A ratio:  1.68         Systemic VTI:  0.32 m                             Systemic Diam: 1.80 cm Rozann Lesches MD Electronically signed by Rozann Lesches MD Signature Date/Time: 07/28/2020/4:41:17 PM    Final    Telemetry    NSR- Personally Reviewed  ECG    No new tracing- Personally Reviewed  Cardiac Studies   LHC 07/28/20:   The left ventricular systolic function is normal.  LV end diastolic pressure is normal.  There is no aortic valve stenosis.  Angiographically normal coronary arteries   Normal cardiac catheterization-no culprit lesion to explain elevated troponin.   Recommendation:  Standard post-cath care  Consider other imaging evaluation to determine potential etiology for elevated troponin such as myocarditis or pericarditis  Echo 09/27/20:  1. Left ventricular ejection fraction, by estimation, is 60 to 65%. The  left ventricle has normal function. The left ventricle has no regional  wall motion abnormalities. Left ventricular diastolic parameters were  normal.  2. Right ventricular  systolic function is normal. The right ventricular  size is normal. There is normal pulmonary artery systolic pressure. The  estimated right ventricular systolic pressure is 06.2 mmHg.  3. Left atrial size was upper normal.  4. The mitral valve is grossly normal. Trivial mitral valve  regurgitation.  5. The aortic valve is tricuspid. Aortic valve regurgitation is not  visualized.  6. The inferior vena cava is dilated in size with >50% respiratory  variability, suggesting right atrial pressure of 8 mmHg.   Patient Profile     45 y.o. male  with past medical history of CAD (reported NSTEMI 8+ years ago with normal cors by cath at that time per his report), HTN, HLD,Type 2 DM and GERD who is being seen today for evaluation of chest pain at the request of Dr. Rogene Houston.   Assessment & Plan    1. Chest with suspicious for NSTEMI: -Transferred from [facility]  for cath which showed normal coronaries -HsT was 450>>2>>13>>16>>concerning for possible coronary spasm  -Will place on amlodipine  -Cath site stable with no s/s of hematoma   2.  HTN -Stable, 125/68>>133/71>>125/72 -Continue Lopressor 25 mg twice daily. -Also on PTA Lisinopril 10 mg daily that was held for cath   3. HLD -Intolerant to statin therapy  -LDL-81 however Trigs found to be elevated at 254 -Start Lovaza and run insurance for OP Vascepa   4. Type 2 DM -HbA1c, 7.6 -Discussed OP follow up with PCP for better DM control  -Restart Metformin tomorrow    Signed, Kathyrn Drown NP-C Rafael Gonzalez Pager: [phone] 07/29/2020, 11:03 AM     Patient seen and examined. Agree with assessment and plan.  Patient is currently pain-free.  He has a remote history of cardiac catheterization approximately 8 years ago while living in [place].  At time he had chest pressure and was found to have normal coronary arteries.  The day prior to his any pain hospitalization he experienced chest tightness and pressure which did not radiate to his jaw but he did note some left arm discomfort.  Troponin was elevated at 450 with prompt normalization.  Repeat catheterization yesterday did not reveal any coronary obstructive disease.  I am concerned that his symptomatology may represent transient coronary vasospasm contributing to his initial peak elevation of troponin and rapid resolution rather than a plateau curve of demand ischemia.  History of hypertension, will initiate amlodipine 5 mg which will have both hypertension as well as vasospasm benefit.  I reviewed his lipid profile.  He has an atherogenic dyslipidemic pattern with elevated triglycerides and increased VLDL.  Although LDL is 86 I suspect that most likely he has small dense LDL subparticles.  He has been on bempedoic acid/Zetia.  Will recommend Vascepa 2 capsules twice daily for omega-3 fatty acid treatment.  He is obese.  We discussed the  importance of weight loss and increased exercise.  We also spent considerable time with him discussing a heart healthy diet in particular the Mediterranean diet.  We will discharge him today with plans for follow-up evaluation with Dr. Domenic Polite.   Troy Sine, MD, [facility] 07/29/2020 11:22 AM    For questions or updates, please contact   Please consult [email or web address] for contact info under Cardiology/STEMI.

## 2020-07-29 NOTE — Discharge Summary (Signed)
Discharge Summary    Patient ID: Jimmy Tyler MRN: [id number]; DOB: [birth year]  Admit date: 07/28/2020 Discharge date: 07/29/2020  PCP:  Loman [place], [place]  Cardiologist:  Dr. Domenic Polite, MD   Discharge Diagnoses    Principal Problem:   Vasospasm [facility]) Active Problems:   Dyslipidemia   DM (diabetes mellitus) ([place])   Chest pain of uncertain etiology  Diagnostic Studies/Procedures    LHC 07/28/20:   The left ventricular systolic function is normal.  LV end diastolic pressure is normal.  There is no aortic valve stenosis.  Angiographically normal coronary arteries  Normal cardiac catheterization-no culprit lesion to explain elevated troponin.  Recommendation:  Standard post-cath care  Consider other imaging evaluation to determine potential etiology for elevated troponin such as myocarditis or pericarditis  Echo 09/27/20:  1. Left ventricular ejection fraction, by estimation, is 60 to 65%. The  left ventricle has normal function. The left ventricle has no regional  wall motion abnormalities. Left ventricular diastolic parameters were  normal.  2. Right ventricular systolic function is normal. The right ventricular  size is normal. There is normal pulmonary artery systolic pressure. The  estimated right ventricular systolic pressure is 94.8 mmHg.  3. Left atrial size was upper normal.  4. The mitral valve is grossly normal. Trivial mitral valve  regurgitation.  5. The aortic valve is tricuspid. Aortic valve regurgitation is not  visualized.  6. The inferior vena cava is dilated in size with >50% respiratory  variability, suggesting right atrial pressure of 8 mmHg.   History of Present Illness     Jimmy Tyler is a 45 y.o. male with past medical history of CAD(reported NSTEMI 8+ years ago with normal cors by cath at that time per his report), HTN,HLD,Type 2 DM and GERD who is being seen today for  evaluation of chest pain at the request of Dr. Rogene Houston.  [facility] 07/28/20 for evaluation of chest pain which had been occurring for several days. He reports being under increased stress over the past few months as his father-in-law passed away from [place] and his wife has been suffering significant grief and this has led to her asking for a divorce at times. 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 Approximately 3 to 4 days ago, he noticed discomfort along his left side which is more of a shooting discomfort but did radiate into his left arm. He tried taking sublingual nitroglycerin with no improvement in his symptoms. He continued to experience intermittent symptoms off and on over the past few days which prompted him to go to urgent care for evaluation where he was subsequently sent to the ED. He reported feeling "off" but with no specific associated symptoms. 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   He reported suffering an MI approximately 8+ years ago in [place] and was informed his cardiac enzymes were elevated at that time but catheterization revealed no significant blockage and he did not require PCI or stent placement.  Initial labs with HS Troponin elevated to 450 and stable EKG. Given this plan was for transfer to [facility] for LHC. Repeat levels at [facility] were 2>>13>>16.   Hospital Course     LHC performed 07/28/20 that showed normal LVEF,  no aortic stenosis and angiographically normal coronary arteries. There was concern that his symptomatology may represent transient coronary vasospasm contributing to his initial peak elevation of troponin and rapid resolution rather than a plateau curve of demand ischemia. Given this, he was started on  amlodipine 5 mg which will have both hypertension as well as vasospasm benefit. He was found to have an atherogenic dyslipidemic pattern with elevated triglycerides and increased VLDL. Although LDL is 86 with suspicion that he has small dense LDL subparticles. He has been on bempedoic acid/Zetia. He was started on Vascepa 2 capsules twice daily for omega-3 fatty acid treatment. We discussed the importance of weight loss and increased exercise.  We also spent considerable time with him discussing a heart healthy diet in particular the Mediterranean diet.  .  Chest with suspicious for NSTEMI: -Transferred from [place] for cath which showed normal coronaries -HsT was 450>>2>>13>>16>>concerning for possible coronary spasm  -Will place on amlodipine  -Cath site stable with no s/s of hematoma   HTN -Stable, 125/68>>133/71>>125/72 -ContinueLopressor 25 mg twice daily. -Also on PTA Lisinopril 10 mg daily that was held for cath   HLD -Intolerant to statin therapy  -LDL-81 however Trigs found to be elevated at 254 -Start Lovaza and run insurance for OP Vascepa   Type 2 DM -HbA1c, 7.6 -Discussed OP follow up with PCP for better DM control  -Restart Metformin tomorrow   I am concerned that his symptomatology may represent transient coronary vasospasm contributing to his initial peak elevation of troponin and rapid resolution rather than a plateau curve of demand ischemia. With hyp[ertension will initiate amlodipine 5 mg which will have both hypertension as well as vasospasm benefit.  I reviewed his lipid profile.  He has an atherogenic dyslipidemic pattern with elevated triglycerides and increased VLDL.  Although LDL is 86 I suspect that most likely he has small dense LDL subparticles.  He has been on bempedoic acid/Zetia.  Will recommend Vascepa 2 capsules twice daily for omega-3 fatty acid treatment.  He is obese.  We discussed the importance of weight loss and increased exercise.  We also spent  considerable time with him discussing a heart healthy diet in particular the Mediterranean diet.  We will discharge him today with plans for follow-up evaluation with Dr. Domenic Polite.   Consultants: None   The patient has been seen and examined by Dr. Claiborne Billings who feels that he is stable and ready for discharge today, 07/29/20.   Did the patient have an acute coronary syndrome (MI, NSTEMI, STEMI, etc) this admission?:  No                               Did the patient have a percutaneous coronary intervention (stent / angioplasty)?:  No.     ____________  Discharge Vitals Blood pressure 134/74, pulse 60, temperature 98.1 F (36.7 C), temperature source Oral, resp. rate 19, height 5\' 11"  (1.803 m), weight 133.8 kg, SpO2 97 %.  Filed Weights   07/28/20 1028  Weight: 133.8 kg    Labs & Radiologic Studies    CBC Recent Labs    07/28/20 1033 07/29/20 0252  WBC 5.3 5.5  HGB 13.5 13.2  HCT 39.5 39.1  MCV 92.5 91.1  PLT 225 341   Basic Metabolic Panel Recent Labs    07/28/20 1033 07/29/20 0252  NA 132* 136  K 4.2 4.0  CL 99 102  CO2 24 28  GLUCOSE 283*  150*  BUN 13 12  CREATININE 0.82 0.83  CALCIUM 8.8* 8.9   Liver Function Tests Recent Labs    07/28/20 1033  AST 30  ALT 51*  ALKPHOS 52  BILITOT 0.4  PROT 7.2  ALBUMIN 3.8   No results for input(s): LIPASE, AMYLASE in the last 72 hours. High Sensitivity Troponin:   Recent Labs  Lab 07/28/20 1033 07/28/20 1234 07/28/20 1659 07/28/20 1729  TROPONINIHS 450* 2 13 16     BNP Invalid input(s): POCBNP D-Dimer No results for input(s): DDIMER in the last 72 hours. Hemoglobin A1C Recent Labs    07/28/20 1659  HGBA1C 7.6*   Fasting Lipid Panel Recent Labs    07/29/20 0252  CHOL 168  HDL 36*  LDLCALC 81  TRIG 254*  CHOLHDL 4.7   Thyroid Function Tests No results for input(s): TSH, T4TOTAL, T3FREE, THYROIDAB in the last 72 hours.  Invalid input(s): FREET3 _____________  DG Chest 2 View  Result Date:  07/28/2020 CLINICAL DATA:  45 year old male with chest pain onset this morning. Former smoker. EXAM: CHEST - 2 VIEW COMPARISON:  None. FINDINGS: Normal lung volumes and mediastinal contours. Visualized tracheal air column is within normal limits. Lung markings are within normal limits. No pneumothorax or pleural effusion. Degenerative endplate spurring in the thoracic spine. No acute osseous abnormality identified. Negative visible bowel gas. IMPRESSION: Negative.  No cardiopulmonary abnormality. Electronically Signed   By: Genevie Ann M.D.   On: 07/28/2020 11:22   CARDIAC CATHETERIZATION  Result Date: 07/28/2020  The left ventricular systolic function is normal.  LV end diastolic pressure is normal.  There is no aortic valve stenosis.  Angiographically normal coronary arteries  Normal cardiac catheterization-no culprit lesion to explain elevated troponin. Recommendation:  Standard post-cath care  Consider other imaging evaluation to determine potential etiology for elevated troponin such as myocarditis or pericarditis. Glenetta Hew, MD  ECHOCARDIOGRAM COMPLETE  Result Date: 07/28/2020    ECHOCARDIOGRAM REPORT   Patient Name:   Jimmy Tyler Date of Exam: 07/28/2020 Medical Rec #:  [id number]        Height:       71.0 in Accession #:    [id number]       Weight:       295.0 lb Date of Birth:  1976/02/25        BSA:          2.487 m Patient Age:    8 years         BP:           137/82 mmHg Patient Gender: M                HR:           56 bpm. Exam Location:  [facility] Procedure: 2D Echo, Cardiac Doppler and Color Doppler Indications:    NSTEMI l21.4  History:        Patient has no prior history of Echocardiogram examinations.                 Previous Myocardial Infarction; Risk Factors:Hypertension,                 Dyslipidemia and Diabetes. OSA on CPAP (From Hx), GERD.  Sonographer:    Alvino Chapel RCS Referring Phys: [id number] [place]  1. Left ventricular ejection fraction, by  estimation, is 60 to 65%. The left ventricle has normal function. The left ventricle has no regional wall motion abnormalities. Left ventricular diastolic  parameters were normal.  2. Right ventricular systolic function is normal. The right ventricular size is normal. There is normal pulmonary artery systolic pressure. The estimated right ventricular systolic pressure is 83.4 mmHg.  3. Left atrial size was upper normal.  4. The mitral valve is grossly normal. Trivial mitral valve regurgitation.  5. The aortic valve is tricuspid. Aortic valve regurgitation is not visualized.  6. The inferior vena cava is dilated in size with >50% respiratory variability, suggesting right atrial pressure of 8 mmHg. FINDINGS  Left Ventricle: Left ventricular ejection fraction, by estimation, is 60 to 65%. The left ventricle has normal function. The left ventricle has no regional wall motion abnormalities. The left ventricular internal cavity size was normal in size. There is  no left ventricular hypertrophy. Left ventricular diastolic parameters were normal. Right Ventricle: The right ventricular size is normal. No increase in right ventricular wall thickness. Right ventricular systolic function is normal. There is normal pulmonary artery systolic pressure. The tricuspid regurgitant velocity is 2.63 m/s, and  with an assumed right atrial pressure of 8 mmHg, the estimated right ventricular systolic pressure is 19.6 mmHg. Left Atrium: Left atrial size was upper normal. Right Atrium: Right atrial size was normal in size. Pericardium: There is no evidence of pericardial effusion. Mitral Valve: The mitral valve is grossly normal. Trivial mitral valve regurgitation. Tricuspid Valve: The tricuspid valve is grossly normal. Tricuspid valve regurgitation is trivial. Aortic Valve: The aortic valve is tricuspid. There is mild aortic valve annular calcification. Aortic valve regurgitation is not visualized. Pulmonic Valve: The pulmonic valve was  grossly normal. Pulmonic valve regurgitation is trivial. Aorta: The aortic root is normal in size and structure. Venous: The inferior vena cava is dilated in size with greater than 50% respiratory variability, suggesting right atrial pressure of 8 mmHg. IAS/Shunts: No atrial level shunt detected by color flow Doppler.  LEFT VENTRICLE PLAX 2D LVIDd:         4.60 cm  Diastology LVIDs:         2.40 cm  LV e' medial:    13.10 cm/s LV PW:         0.90 cm  LV E/e' medial:  9.8 LV IVS:        0.90 cm  LV e' lateral:   11.40 cm/s LVOT diam:     1.80 cm  LV E/e' lateral: 11.3 LV SV:         83 LV SV Index:   33 LVOT Area:     2.54 cm  RIGHT VENTRICLE RV S prime:     14.60 cm/s TAPSE (M-mode): 3.1 cm LEFT ATRIUM             Index       RIGHT ATRIUM           Index LA diam:        4.10 cm 1.65 cm/m  RA Area:     15.90 cm LA Vol (A2C):   63.0 ml 25.33 ml/m RA Volume:   38.20 ml  15.36 ml/m LA Vol (A4C):   81.3 ml 32.69 ml/m LA Biplane Vol: 73.5 ml 29.55 ml/m  AORTIC VALVE LVOT Vmax:   165.00 cm/s LVOT Vmean:  105.000 cm/s LVOT VTI:    0.325 m  AORTA Ao Root diam: 2.80 cm MITRAL VALVE                TRICUSPID VALVE MV Area (PHT): 3.08 cm     TR Peak grad:  27.7 mmHg MV Decel Time: 246 msec     TR Vmax:        263.00 cm/s MV E velocity: 129.00 cm/s MV A velocity: 76.60 cm/s   SHUNTS MV E/A ratio:  1.68         Systemic VTI:  0.32 m                             Systemic Diam: 1.80 cm Rozann Lesches MD Electronically signed by Rozann Lesches MD Signature Date/Time: 07/28/2020/4:41:17 PM    Final    Disposition   Pt is being discharged home today in good condition.  Follow-up Plans & Appointments     Follow-up Information    Erma Heritage, PA-C Follow up on 08/17/2020.   Specialties: Librarian, academic, Cardiology Why: at 230pm  Contact information: [place] [address] [phone]              Discharge Instructions    Call MD for:  difficulty breathing, headache or visual  disturbances   Complete by: As directed    Call MD for:  extreme fatigue   Complete by: As directed    Call MD for:  hives   Complete by: As directed    Call MD for:  persistant dizziness or light-headedness   Complete by: As directed    Call MD for:  persistant nausea and vomiting   Complete by: As directed    Call MD for:  redness, tenderness, or signs of infection (pain, swelling, redness, odor or green/yellow discharge around incision site)   Complete by: As directed    Call MD for:  severe uncontrolled pain   Complete by: As directed    Call MD for:  temperature >100.4   Complete by: As directed    Diet - low sodium heart healthy   Complete by: As directed    Discharge instructions   Complete by: As directed    No driving for 3 days. No lifting over 5 lbs for 1 week. No sexual activity for 1 week. Keep procedure site clean & dry. If you notice increased pain, swelling, bleeding or pus, call/return!  You may shower, but no soaking baths/hot tubs/pools for 1 week.   Increase activity slowly   Complete by: As directed       Discharge Medications   Allergies as of 07/29/2020      Reactions   Gramineae Pollens    Statins Other (See Comments)   Body aches.  Failed 3 different ones.      Medication List    STOP taking these medications   omeprazole 20 MG capsule Commonly known as: PRILOSEC     TAKE these medications   amLODipine 5 MG tablet Commonly known as: NORVASC Take 1 tablet (5 mg total) by mouth daily. Start taking on: July 30, 2020   aspirin 81 MG EC tablet Take 1 tablet (81 mg total) by mouth daily. Swallow whole. Start taking on: July 30, 2020   indomethacin 25 MG capsule Commonly known as: INDOCIN Take 25 mg by mouth 2 (two) times daily.   lisinopril 10 MG tablet Commonly known as: ZESTRIL Take 1 tablet (10 mg total) by mouth daily. (NEEDS TO BE SEEN BEFORE NEXT REFILL)   loperamide 2 MG capsule Commonly known as: IMODIUM Take 6-8 mg by mouth  daily as needed for diarrhea or loose stools.   metFORMIN 500 MG 24 hr  tablet Commonly known as: GLUCOPHAGE-XR Take 2 tablets (1,000 mg total) by mouth daily with breakfast AND 1 tablet (500 mg total) daily with supper. Notes to patient: Restart 07/30/20   metoprolol tartrate 25 MG tablet Commonly known as: LOPRESSOR Take 1 tablet (25 mg total) by mouth 2 (two) times daily.   Nexlizet 180-10 MG Tabs Generic drug: Bempedoic Acid-Ezetimibe Take 1 tablet by mouth daily. (NEEDS TO BE SEEN BEFORE NEXT REFILL)   omega-3 acid ethyl esters 1 g capsule Commonly known as: LOVAZA Take 1 capsule (1 g total) by mouth 2 (two) times daily.   PEPCID PO Take 20 mg by mouth at bedtime.        Outstanding Labs/Studies   Lipid panel   Duration of Discharge Encounter   Greater than 30 minutes including physician time.  Signed, Kathyrn Drown, NP 07/29/2020, 12:38 PM

## 2020-07-29 NOTE — TOC Benefit Eligibility Note (Signed)
Transition of Care [facility]) Benefit Eligibility Note    Patient Details  Name: Jimmy Tyler MRN: [id number] Date of Birth: 04-06-1976   Medication/Dose: Dalene Seltzer  2 GM BID        Prescription Coverage Preferred Pharmacy: [facility] [place] with Person/Company/Phone Number:: DEARTRA  @ [employer] [place] # [phone]  Co-Pay: $ 4.00 FOR EACH  PRESCRIPTION  Prior Approval: No          Memory Argue Phone Number: 07/29/2020, 1:32 PM

## 2020-08-17 ENCOUNTER — Ambulatory Visit: Payer: Medicaid Other | Admitting: Student

## 2020-08-17 NOTE — Progress Notes (Deleted)
Cardiology Office Note    Date:  08/17/2020   ID:  Jimmy Tyler, DOB [birth year], MRN [id number]  PCP:  Loman Brooklyn, FNP  Cardiologist: Rozann Lesches, MD    No chief complaint on file.   History of Present Illness:    Jimmy Tyler is a 45 y.o. male with past medical history of CAD (reported NSTEMI 8+ years ago with normal cors by cath at that time per his report), HTN, HLD,Type 2 DM and GERD who presents to the office today for hospital follow-up.  He most recently presented to [facility] ED on 07/28/2020 for evaluation of chest discomfort over the past several days which had continued to increase in frequency and severity. His initial troponin value was elevated to 450 and given this along with his presenting symptoms, he was transferred to [facility] for a cardiac catheterization for definitive evaluation. This showed angiographically normal coronary arteries with no culprit lesion to explain his troponin values.  Echocardiogram showed a preserved EF of 60 to 65% with no regional wall motion abnormalities. Subsequent Hs Troponin values were flat at 2, 13 and 16 and it was felt his enzyme elevation and symptoms could be secondary to coronary vasospasm. He was started on Amlodipine 5 mg daily to assist with both hypertension and vasospasms. Was also started on Lovaza while being continued on Nexlizet (statin intolerant).         Past Medical History:  Diagnosis Date  . Allergy   . Asthma   . Diabetes mellitus without complication ([place])   . GERD (gastroesophageal reflux disease)   . History of Clostridium difficile infection   . History of non-ST elevation myocardial infarction (NSTEMI)   . Hyperlipidemia   . Hypertension   . Myocardial infarction ([place]) 2014  . OSA on CPAP   . Testicular hypofunction   . Umbilical hernia     Past Surgical History:  Procedure Laterality Date  . BARIATRIC SURGERY  07/13/2013  . BIOPSY  07/19/2020   Procedure: BIOPSY;  Surgeon:  Eloise Harman, DO;  [facility];  Service: Endoscopy;;  . COLONOSCOPY WITH PROPOFOL N/A 07/19/2020   Procedure: COLONOSCOPY WITH PROPOFOL;  Surgeon: Eloise Harman, DO;  Location[facility];  Service: Endoscopy;  Laterality: N/A;  early am appt  . ESOPHAGOGASTRODUODENOSCOPY (EGD) WITH PROPOFOL N/A 07/19/2020   Procedure: ESOPHAGOGASTRODUODENOSCOPY (EGD) WITH PROPOFOL;  Surgeon: Eloise Harman, DO;  Location[facility];  Service: Endoscopy;  Laterality: N/A;  . HIATAL HERNIA REPAIR    . KNEE SURGERY Left 2015   Due to meniscal tear  . LEFT HEART CATH AND CORONARY ANGIOGRAPHY N/A 07/28/2020   Procedure: LEFT HEART CATH AND CORONARY ANGIOGRAPHY;  Surgeon: Leonie Man, MD;  Location: [facility];  Service: Cardiovascular;  Laterality: N/A;  . NASAL SEPTUM SURGERY  03/2019  . POLYPECTOMY  07/19/2020   Procedure: POLYPECTOMY;  Surgeon: Eloise Harman, DO;  [facility]: [facility];  Service: Endoscopy;;    Current Medications: Outpatient Medications Prior to Visit  Medication Sig Dispense Refill  . amLODipine (NORVASC) 5 MG tablet Take 1 tablet (5 mg total) by mouth daily. 90 tablet 3  . aspirin EC 81 MG EC tablet Take 1 tablet (81 mg total) by mouth daily. Swallow whole. 30 tablet 11  . Bempedoic Acid-Ezetimibe (NEXLIZET) 180-10 MG TABS Take 1 tablet by mouth daily. (NEEDS TO BE SEEN BEFORE NEXT REFILL) 30 tablet 0  . Famotidine (PEPCID PO)  Take 20 mg by mouth at bedtime.    . indomethacin (INDOCIN) 25 MG capsule Take 25 mg by mouth 2 (two) times daily.    [name] lisinopril (ZESTRIL) 10 MG tablet Take 1 tablet (10 mg total) by mouth daily. (NEEDS TO BE SEEN BEFORE NEXT REFILL) 30 tablet 0  . loperamide (IMODIUM) 2 MG capsule Take 6-8 mg by mouth daily as needed for diarrhea or loose stools.    . metFORMIN (GLUCOPHAGE-XR) 500 MG 24 hr tablet Take 2 tablets (1,000 mg total) by mouth daily with breakfast AND 1 tablet (500 mg total) daily with supper. 270 tablet 1  .  metoprolol tartrate (LOPRESSOR) 25 MG tablet Take 1 tablet (25 mg total) by mouth 2 (two) times daily. 180 tablet 0  . omega-3 acid ethyl esters (LOVAZA) 1 g capsule Take 1 capsule (1 g total) by mouth 2 (two) times daily. 60 capsule 2   No facility-administered medications prior to visit.     Allergies:   Gramineae pollens and Statins   Social History   Socioeconomic History  . Marital status: Married    Spouse name: Not on file  . Number of children: Not on file  . Years of education: Not on file  . Highest education level: Not on file  Occupational History  . Not on file  Tobacco Use  . Smoking status: Former Smoker    Packs/day: 1.00    Types: Cigarettes    Quit date: 02/04/2014    Years since quitting: 6.5  . Smokeless tobacco: Never Used  Vaping Use  . Vaping Use: Never used  Substance and Sexual Activity  . Alcohol use: Yes    Comment: very rarely   . Drug use: Never  . Sexual activity: Yes    Birth control/protection: None  Other Topics Concern  . Not on file  Social History Narrative  . Not on file   Social Determinants of Health   Financial Resource Strain: Not on file  Food Insecurity: Not on file  Transportation Needs: Not on file  Physical Activity: Not on file  Stress: Not on file  Social Connections: Not on file     Family History:  The patient's ***family history includes Autism in his son; COPD in his mother; Heart attack in his maternal grandfather; Skin cancer in his maternal grandmother.   Review of Systems:   Please see the history of present illness.     General:  No chills, fever, night sweats or weight changes.  Cardiovascular:  No chest pain, dyspnea on exertion, edema, orthopnea, palpitations, paroxysmal nocturnal dyspnea. Dermatological: No rash, lesions/masses Respiratory: No cough, dyspnea Urologic: No hematuria, dysuria Abdominal:   No nausea, vomiting, diarrhea, bright red blood per rectum, melena, or hematemesis Neurologic:  No  visual changes, wkns, changes in mental status. All other systems reviewed and are otherwise negative except as noted above.   Physical Exam:    VS:  There were no vitals taken for this visit.   General: Well developed, well nourished,male appearing in no acute distress. Head: Normocephalic, atraumatic. Neck: No carotid bruits. JVD not elevated.  Lungs: Respirations regular and unlabored, without wheezes or rales.  Heart: ***Regular rate and rhythm. No S3 or S4.  No murmur, no rubs, or gallops appreciated. Abdomen: Appears non-distended. No obvious abdominal masses. Msk:  Strength and tone appear normal for age. No obvious joint deformities or effusions. Extremities: No clubbing or cyanosis. No edema.  Distal pedal pulses are 2+ bilaterally. Neuro: Alert and  oriented X 3. Moves all extremities spontaneously. No focal deficits noted. Psych:  Responds to questions appropriately with a normal affect. Skin: No rashes or lesions noted  Wt Readings from Last 3 Encounters:  07/28/20 295 lb (133.8 kg)  07/15/20 295 lb (133.8 kg)  06/28/20 294 lb 5 oz (133.5 kg)        Studies/Labs Reviewed:   EKG:  EKG is*** ordered today.  The ekg ordered today demonstrates ***  Recent Labs: 02/08/2020: BNP 12.0; TSH 1.850 07/28/2020: ALT 51 07/29/2020: BUN 12; Creatinine, Ser 0.83; Hemoglobin 13.2; Platelets 210; Potassium 4.0; Sodium 136   Lipid Panel    Component Value Date/Time   CHOL 168 07/29/2020 0252   CHOL 203 (H) 02/08/2020 0820   TRIG 254 (H) 07/29/2020 0252   HDL 36 (L) 07/29/2020 0252   HDL 42 02/08/2020 0820   CHOLHDL 4.7 07/29/2020 0252   VLDL 51 (H) 07/29/2020 0252   LDLCALC 81 07/29/2020 0252   LDLCALC 116 (H) 02/08/2020 0820    Additional studies/ records that were reviewed today include:   Echocardiogram: 07/2020 IMPRESSIONS    1. Left ventricular ejection fraction, by estimation, is 60 to 65%. The  left ventricle has normal function. The left ventricle has no  regional  wall motion abnormalities. Left ventricular diastolic parameters were  normal.  2. Right ventricular systolic function is normal. The right ventricular  size is normal. There is normal pulmonary artery systolic pressure. The  estimated right ventricular systolic pressure is 09.3 mmHg.  3. Left atrial size was upper normal.  4. The mitral valve is grossly normal. Trivial mitral valve  regurgitation.  5. The aortic valve is tricuspid. Aortic valve regurgitation is not  visualized.  6. The inferior vena cava is dilated in size with >50% respiratory  variability, suggesting right atrial pressure of 8 mmHg.   Cardiac Catheterization: 07/2020  The left ventricular systolic function is normal.  LV end diastolic pressure is normal.  There is no aortic valve stenosis.  Angiographically normal coronary arteries   Normal cardiac catheterization-no culprit lesion to explain elevated troponin.   Recommendation:  Standard post-cath care  Consider other imaging evaluation to determine potential etiology for elevated troponin such as myocarditis or pericarditis.    Assessment:    No diagnosis found.   Plan:   In order of problems listed above:  1. ***    Shared Decision Making/Informed Consent:   {Are you ordering a CV Procedure (e.g. stress test, cath, DCCV, TEE, etc)?   Press F2        :[id number]}    Medication Adjustments/Labs and Tests Ordered: Current medicines are reviewed at length with the patient today.  Concerns regarding medicines are outlined above.  Medication changes, Labs and Tests ordered today are listed in the Patient Instructions below. There are no Patient Instructions on file for this visit.   Signed, Erma Heritage, PA-C  08/17/2020 10:01 AM    [facility] [address] [place], [place] [address] Phone: [phone] Fax: [phone]

## 2020-08-19 DIAGNOSIS — Z419 Encounter for procedure for purposes other than remedying health state, unspecified: Secondary | ICD-10-CM | POA: Diagnosis not present

## 2020-08-31 ENCOUNTER — Other Ambulatory Visit: Payer: Self-pay | Admitting: Family Medicine

## 2020-08-31 DIAGNOSIS — E785 Hyperlipidemia, unspecified: Secondary | ICD-10-CM

## 2020-09-01 ENCOUNTER — Encounter: Payer: Self-pay | Admitting: Family Medicine

## 2020-09-01 ENCOUNTER — Ambulatory Visit (INDEPENDENT_AMBULATORY_CARE_PROVIDER_SITE_OTHER): Payer: Medicaid Other | Admitting: Family Medicine

## 2020-09-01 DIAGNOSIS — E1165 Type 2 diabetes mellitus with hyperglycemia: Secondary | ICD-10-CM | POA: Diagnosis not present

## 2020-09-01 DIAGNOSIS — R7989 Other specified abnormal findings of blood chemistry: Secondary | ICD-10-CM

## 2020-09-01 DIAGNOSIS — I201 Angina pectoris with documented spasm: Secondary | ICD-10-CM | POA: Diagnosis not present

## 2020-09-01 MED ORDER — METFORMIN HCL ER (MOD) 1000 MG PO TB24: 1000.0000 mg | ORAL_TABLET | Freq: Every evening | ORAL | 1 refills | Status: DC

## 2020-09-01 MED ORDER — DAPAGLIFLOZIN PROPANEDIOL 10 MG PO TABS: ORAL_TABLET | ORAL | 2 refills | Status: DC

## 2020-09-01 NOTE — Progress Notes (Signed)
Virtual Visit via Telephone Note  I connected with Jimmy Tyler on 09/01/20 at 3:37 PM by telephone and verified that I am speaking with the correct person using two identifiers. Jimmy Tyler is currently located in his vehicle and nobody is currently with him during this visit. The provider, Loman Brooklyn, FNP is located in their office at time of visit.  I discussed the limitations, risks, security and privacy concerns of performing an evaluation and management service by telephone and the availability of in person appointments. I also discussed with the patient that there may be a patient responsible charge related to this service. The patient expressed understanding and agreed to proceed.  Subjective: PCP: Loman Brooklyn, FNP  Chief Complaint  Patient presents with  . Hospitalization Follow-up   Patient was admitted to [facility] 07/28/2020 to 07/29/2020 due to chest pain.  A left heart catheterization was performed which showed a normal LVEF, no aortic stenosis and angiographically normal coronary arteries.  It was felt he was having coronary vasospasm and was started on amlodipine 5 mg once daily.  He was also started on Vascepa 2 capsules twice daily due to elevated triglycerides.  While in the hospital his A1c was 7.6.  Patient was scheduled to follow-up with cardiology on 08/17/2020 but missed this appointment.  Patient denies any further episodes of chest pain since he was in the hospital.  He has been taking the amlodipine 5 mg once daily.  Patient reports he has been taking metformin consistently in the evening but does not typically remember the morning dose.  He is taking 1000 mg in the evening.  Additionally patient would like his testosterone level checked.  He reports he used to be on testosterone.  He feels this is why he feels tired and sluggish all the time.   ROS: Per HPI  Current Outpatient Medications:  .  amLODipine (NORVASC) 5 MG tablet, Take 1  tablet (5 mg total) by mouth daily., Disp: 90 tablet, Rfl: 3 .  aspirin EC 81 MG EC tablet, Take 1 tablet (81 mg total) by mouth daily. Swallow whole., Disp: 30 tablet, Rfl: 11 .  Bempedoic Acid-Ezetimibe (NEXLIZET) 180-10 MG TABS, Take 1 tablet by mouth daily., Disp: 30 tablet, Rfl: 0 .  Famotidine (PEPCID PO), Take 20 mg by mouth at bedtime., Disp: , Rfl:  .  indomethacin (INDOCIN) 25 MG capsule, Take 25 mg by mouth 2 (two) times daily., Disp: , Rfl:  .  lisinopril (ZESTRIL) 10 MG tablet, Take 1 tablet (10 mg total) by mouth daily., Disp: 30 tablet, Rfl: 0 .  loperamide (IMODIUM) 2 MG capsule, Take 6-8 mg by mouth daily as needed for diarrhea or loose stools., Disp: , Rfl:  .  metFORMIN (GLUCOPHAGE-XR) 500 MG 24 hr tablet, Take 2 tablets (1,000 mg total) by mouth daily with breakfast AND 1 tablet (500 mg total) daily with supper., Disp: 270 tablet, Rfl: 1 .  metoprolol tartrate (LOPRESSOR) 25 MG tablet, Take 1 tablet (25 mg total) by mouth 2 (two) times daily., Disp: 180 tablet, Rfl: 0 .  omega-3 acid ethyl esters (LOVAZA) 1 g capsule, Take 1 capsule (1 g total) by mouth 2 (two) times daily., Disp: 60 capsule, Rfl: 2  Allergies  Allergen Reactions  . Gramineae Pollens   . Statins Other (See Comments)    Body aches.  Failed 3 different ones.   Past Medical History:  Diagnosis Date  . Allergy   . Asthma   .  Diabetes mellitus without complication ([place])   . GERD (gastroesophageal reflux disease)   . History of Clostridium difficile infection   . History of non-ST elevation myocardial infarction (NSTEMI)   . Hyperlipidemia   . Hypertension   . Myocardial infarction ([place]) 2014  . OSA on CPAP   . Testicular hypofunction   . Umbilical hernia     Observations/Objective: A&O  No respiratory distress or wheezing audible over the phone Mood, judgement, and thought processes all WNL   Assessment and Plan: 1. Coronary vasospasm (HCC) Well controlled on current regimen.  Patient to  reschedule follow-up with cardiology.  2. Type 2 diabetes mellitus with hyperglycemia, without long-term current use of insulin (HCC) Uncontrolled.  Patient does not remember to take his medication as prescribed so I am changing his metformin to 1000 mg once daily and adding Wilder Glade so that he can take both of them in the evening when he remembers to take his medications.  Advised to take Farxiga 5 mg once daily x1 week and then increase to 10 mg once daily.  Encourage patient to schedule a diabetic eye exam.  He is intolerant to statins.  He is on an ACE inhibitor. - dapagliflozin propanediol (FARXIGA) 10 MG TABS tablet; Take 5 mg (1/2 tablet) by mouth once daily x1 week, then increase to 10 mg once daily.  Dispense: 30 tablet; Refill: 2 - metFORMIN (GLUMETZA) 1000 MG (MOD) 24 hr tablet; Take 1 tablet (1,000 mg total) by mouth every evening.  Dispense: 90 tablet; Refill: 1  3. Low testosterone Advised to come before 10 AM for lab work. - Testosterone,Free and Total; Future   Follow Up Instructions: Return in about 3 months (around 12/01/2020) for follow-up of chronic medication conditions.  I discussed the assessment and treatment plan with the patient. The patient was provided an opportunity to ask questions and all were answered. The patient agreed with the plan and demonstrated an understanding of the instructions.   The patient was advised to call back or seek an in-person evaluation if the symptoms worsen or if the condition fails to improve as anticipated.  The above assessment and management plan was discussed with the patient. The patient verbalized understanding of and has agreed to the management plan. Patient is aware to call the clinic if symptoms persist or worsen. Patient is aware when to return to the clinic for a follow-up visit. Patient educated on when it is appropriate to go to the emergency department.   Time call ended: 3:55 PM  I provided 18 minutes of non-face-to-face  time during this encounter.  Hendricks Limes, MSN, APRN, FNP-C [facility] 09/01/20

## 2020-09-02 ENCOUNTER — Other Ambulatory Visit: Payer: Self-pay | Admitting: Family Medicine

## 2020-09-02 DIAGNOSIS — E1165 Type 2 diabetes mellitus with hyperglycemia: Secondary | ICD-10-CM

## 2020-09-05 DIAGNOSIS — I201 Angina pectoris with documented spasm: Secondary | ICD-10-CM | POA: Insufficient documentation

## 2020-09-05 NOTE — Telephone Encounter (Signed)
Please make sure patient is aware I had to re-send the 500 mg tablets so he will need to take two each evening.

## 2020-09-05 NOTE — Telephone Encounter (Signed)
Spoke with [facility], this particular Metformin is the modified version of the regular Metformin extended release, it's actually Glumetza and costs about $10,000.  Thinking you meant to send in regular extended release.

## 2020-09-06 ENCOUNTER — Telehealth: Payer: Self-pay | Admitting: *Deleted

## 2020-09-06 DIAGNOSIS — E1165 Type 2 diabetes mellitus with hyperglycemia: Secondary | ICD-10-CM

## 2020-09-06 NOTE — Telephone Encounter (Signed)
   Key: Ezzard Standing 10MG  tablets  Outcome: Prior Authorization Not Required  WM called  Aware

## 2020-09-12 ENCOUNTER — Other Ambulatory Visit: Payer: Self-pay

## 2020-09-12 ENCOUNTER — Other Ambulatory Visit: Payer: Medicaid Other

## 2020-09-12 DIAGNOSIS — R7989 Other specified abnormal findings of blood chemistry: Secondary | ICD-10-CM

## 2020-09-14 LAB — TESTOSTERONE,FREE AND TOTAL
Testosterone, Free: 11.5 pg/mL (ref 6.8–21.5)
Testosterone: 517 ng/dL (ref 264–916)

## 2020-09-18 DIAGNOSIS — Z419 Encounter for procedure for purposes other than remedying health state, unspecified: Secondary | ICD-10-CM | POA: Diagnosis not present

## 2020-09-28 ENCOUNTER — Other Ambulatory Visit: Payer: Self-pay | Admitting: Family Medicine

## 2020-09-28 DIAGNOSIS — E785 Hyperlipidemia, unspecified: Secondary | ICD-10-CM

## 2020-10-13 ENCOUNTER — Ambulatory Visit (INDEPENDENT_AMBULATORY_CARE_PROVIDER_SITE_OTHER): Payer: Medicaid Other | Admitting: Family

## 2020-10-13 ENCOUNTER — Encounter: Payer: Self-pay | Admitting: Family

## 2020-10-13 DIAGNOSIS — Z20828 Contact with and (suspected) exposure to other viral communicable diseases: Secondary | ICD-10-CM

## 2020-10-13 NOTE — Progress Notes (Signed)
   Virtual Visit  Note Due to COVID-19 pandemic this visit was conducted virtually. This visit type was conducted due to national recommendations for restrictions regarding the COVID-19 Pandemic (e.g. social distancing, sheltering in place) in an effort to limit this patient's exposure and mitigate transmission in our community. All issues noted in this document were discussed and addressed.  A physical exam was not performed with this format.  I connected with Jimmy Tyler on 10/13/20 at 4:38 pm  by telephone and verified that I am speaking with the correct person using two identifiers. Jimmy Tyler is currently located at home and no one is currently with him  during visit. The provider, Evelina Dun, FNP is located in their office at time of visit.  I discussed the limitations, risks, security and privacy concerns of performing an evaluation and management service by telephone and the availability of in person appointments. I also discussed with the patient that there may be a patient responsible charge related to this service. The patient expressed understanding and agreed to proceed.   History and Present Illness:  HPI Pt calls the office today with exposure to Mono from wife and son.   He denies any sore throat, fever, SOB, cough, or fatigue.   Review of Systems  All other systems reviewed and are negative.    Observations/Objective: No SOB or distress noted   Assessment and Plan: 1. Exposure to mononucleosis syndrome Pt currently not having any symptoms, discussed he did not need to be tested as this is viral and if were positive we would not treat.  I have ordered lab work, in case the next few weeks he starts to feel fatigued or sore throat.  - Epstein-Barr virus VCA antibody panel; Future     I discussed the assessment and treatment plan with the patient. The patient was provided an opportunity to ask questions and all were answered. The patient agreed with the plan  and demonstrated an understanding of the instructions.   The patient was advised to call back or seek an in-person evaluation if the symptoms worsen or if the condition fails to improve as anticipated.  The above assessment and management plan was discussed with the patient. The patient verbalized understanding of and has agreed to the management plan. Patient is aware to call the clinic if symptoms persist or worsen. Patient is aware when to return to the clinic for a follow-up visit. Patient educated on when it is appropriate to go to the emergency department.   Time call ended:  4:49 pm   I provided 11 minutes of  non face-to-face time during this encounter.    Evelina Dun, FNP

## 2020-10-17 NOTE — Progress Notes (Deleted)
Referring Provider: Loman Brooklyn, FNP Primary Care Physician:  Loman Brooklyn, FNP Primary GI Physician: Dr. Abbey Chatters  No chief complaint on file.   HPI:   Jimmy Tyler is a 45 y.o. male presenting today for follow-up of diarrhea.  GERD, and dysphagia s/p EGD and colonoscopy.  Last seen in our office at the time of initial consult 06/16/2020.  He reported chronic history of GI issues since his 31s with chronic diarrhea that was self-limiting until approximately 1-2 years ago when he was hospitalized for C. difficile colitis.  Did well after treatment for C. difficile, but felt diarrhea has been progressively worsening.  Taking up to 4 Imodium at night which allowed him to have 1-2 normal loose bowel movements daily.  No BRBPR or melena.  Prior celiac panel was borderline.   TSH normal. Also with chronic reflux improved after recently starting omeprazole 20 mg nightly.  Also on famotidine.  Reported occasional dysphagia to solids.  He was scheduled for EGD and colonoscopy and advised to continue his current medications.  Procedures 07/19/2020: EGD: Sleeve gastrectomy was found with healthy-appearing mucosa, gastritis biopsied, normal examined duodenum s/p biopsy.  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 pylori negative.  Duodenal biopsy benign. Recommended Prilosec 20 mg twice daily x8 weeks then once daily. Colonoscopy:  Three 4-6 mm polyps in the transverse colon resected and retrieved (tubular adenoma and hyperplastic polyp), four 5-7 mm polyps in the rectosigmoid and sigmoid colon resected and retrieved (tubular adenomas, hyperplastic polyp).  Random colon biopsies taken to evaluate for microscopic colitis (negative).  Recommended repeat colonoscopy in 3 years.  Today:  GERD:   Dysphagia:  Diarrhea:    Trial gluten free.   Past  Medical History:  Diagnosis Date  . Allergy   . Asthma   . Diabetes mellitus without complication ([place])   . GERD (gastroesophageal reflux disease)   . History of Clostridium difficile infection   . History of non-ST elevation myocardial infarction (NSTEMI)   . Hyperlipidemia   . Hypertension   . Myocardial infarction ([place]) 2014  . OSA on CPAP   . Testicular hypofunction   . Umbilical hernia     Past Surgical History:  Procedure Laterality Date  . BARIATRIC SURGERY  07/13/2013  . BIOPSY  07/19/2020   Procedure: BIOPSY;  Surgeon: Eloise Harman, DO;  [facility];  Service: Endoscopy;;  . COLONOSCOPY WITH PROPOFOL N/A 07/19/2020   Procedure: COLONOSCOPY WITH PROPOFOL;  Surgeon: Eloise Harman, DO;  [facility];  Service: Endoscopy;  Laterality: N/A;  early am appt  . ESOPHAGOGASTRODUODENOSCOPY (EGD) WITH PROPOFOL N/A 07/19/2020   Procedure: ESOPHAGOGASTRODUODENOSCOPY (EGD) WITH PROPOFOL;  Surgeon: Eloise Harman, DO;  [facility];  Service: Endoscopy;  Laterality: N/A;  . HIATAL HERNIA REPAIR    . KNEE SURGERY Left 2015   Due to meniscal tear  . LEFT HEART CATH AND CORONARY ANGIOGRAPHY N/A 07/28/2020   Procedure: LEFT HEART CATH AND CORONARY ANGIOGRAPHY;  Surgeon: Leonie Man, MD;  Location: [facility];  Service: Cardiovascular;  Laterality: N/A;  . NASAL SEPTUM SURGERY  03/2019  . POLYPECTOMY  07/19/2020   Procedure: POLYPECTOMY;  Surgeon: Eloise Harman, DO;  [facility];  Service: Endoscopy;;    Current Outpatient Medications  Medication Sig Dispense Refill  . amLODipine (NORVASC)  5 MG tablet Take 1 tablet (5 mg total) by mouth daily. 90 tablet 3  . aspirin EC 81 MG EC tablet Take 1 tablet (81 mg total) by mouth daily. Swallow whole. 30 tablet 11  . dapagliflozin propanediol (FARXIGA) 10 MG TABS tablet Take 5 mg (1/2 tablet) by mouth once daily x1 week, then increase to 10 mg once daily. 30 tablet 2  . Famotidine  (PEPCID PO) Take 20 mg by mouth at bedtime.    . indomethacin (INDOCIN) 25 MG capsule Take 25 mg by mouth 2 (two) times daily.    [name] lisinopril (ZESTRIL) 10 MG tablet Take 1 tablet by mouth once daily 30 tablet 4  . loperamide (IMODIUM) 2 MG capsule Take 6-8 mg by mouth daily as needed for diarrhea or loose stools.    . metFORMIN (GLUCOPHAGE-XR) 500 MG 24 hr tablet Take 2 tablets (1,000 mg total) by mouth every evening. 180 tablet 1  . metoprolol tartrate (LOPRESSOR) 25 MG tablet Take 1 tablet (25 mg total) by mouth 2 (two) times daily. 180 tablet 0  . NEXLIZET 180-10 MG TABS Take 1 tablet by mouth once daily 30 tablet 4  . omega-3 acid ethyl esters (LOVAZA) 1 g capsule Take 1 capsule (1 g total) by mouth 2 (two) times daily. 60 capsule 2   No current facility-administered medications for this visit.    Allergies as of 10/19/2020 - Review Complete 10/13/2020  Allergen Reaction Noted  . Gramineae pollens  05/12/2020  . Statins Other (See Comments) 02/07/2020    Family History  Problem Relation Age of Onset  . COPD Mother   . Autism Son   . Skin cancer Maternal Grandmother   . Heart attack Maternal Grandfather     Social History   Socioeconomic History  . Marital status: Married    Spouse name: Not on file  . Number of children: Not on file  . Years of education: Not on file  . Highest education level: Not on file  Occupational History  . Not on file  Tobacco Use  . Smoking status: Former Smoker    Packs/day: 1.00    Types: Cigarettes    Quit date: 02/04/2014    Years since quitting: 6.7  . Smokeless tobacco: Never Used  Vaping Use  . Vaping Use: Never used  Substance and Sexual Activity  . Alcohol use: Yes    Comment: very rarely   . Drug use: Never  . Sexual activity: Yes    Birth control/protection: None  Other Topics Concern  . Not on file  Social History Narrative  . Not on file   Social Determinants of Health   Financial Resource Strain: Not on file  Food  Insecurity: Not on file  Transportation Needs: Not on file  Physical Activity: Not on file  Stress: Not on file  Social Connections: Not on file    Review of Systems: Gen: Denies fever, chills, anorexia. Denies fatigue, weakness, weight loss.  CV: Denies chest pain, palpitations, syncope, peripheral edema, and claudication. Resp: Denies dyspnea at rest, cough, wheezing, coughing up blood, and pleurisy. GI: Denies vomiting blood, jaundice, and fecal incontinence.   Denies dysphagia or odynophagia. Derm: Denies rash, itching, dry skin Psych: Denies depression, anxiety, memory loss, confusion. No homicidal or suicidal ideation.  Heme: Denies bruising, bleeding, and enlarged lymph nodes.  Physical Exam: There were no vitals taken for this visit. General:   Alert and oriented. No distress noted. Pleasant and cooperative.  Head:  Normocephalic  and atraumatic. Eyes:  Conjuctiva clear without scleral icterus. Mouth:  Oral mucosa pink and moist. Good dentition. No lesions. Heart:  S1, S2 present without murmurs appreciated. Lungs:  Clear to auscultation bilaterally. No wheezes, rales, or rhonchi. No distress.  Abdomen:  +BS, soft, non-tender and non-distended. No rebound or guarding. No HSM or masses noted. Msk:  Symmetrical without gross deformities. Normal posture. Extremities:  Without edema. Neurologic:  Alert and  oriented x4 Psych:  Alert and cooperative. Normal mood and affect.

## 2020-10-19 ENCOUNTER — Ambulatory Visit: Payer: Medicaid Other | Admitting: Gastroenterology

## 2020-10-19 DIAGNOSIS — Z419 Encounter for procedure for purposes other than remedying health state, unspecified: Secondary | ICD-10-CM | POA: Diagnosis not present

## 2020-11-18 DIAGNOSIS — Z419 Encounter for procedure for purposes other than remedying health state, unspecified: Secondary | ICD-10-CM | POA: Diagnosis not present

## 2020-12-02 ENCOUNTER — Other Ambulatory Visit: Payer: Self-pay | Admitting: *Deleted

## 2020-12-02 MED ORDER — METOPROLOL TARTRATE 25 MG PO TABS: 25.0000 mg | ORAL_TABLET | Freq: Two times a day (BID) | ORAL | 0 refills | Status: DC

## 2020-12-10 DIAGNOSIS — M5126 Other intervertebral disc displacement, lumbar region: Secondary | ICD-10-CM | POA: Diagnosis not present

## 2020-12-10 DIAGNOSIS — M9903 Segmental and somatic dysfunction of lumbar region: Secondary | ICD-10-CM | POA: Diagnosis not present

## 2020-12-15 ENCOUNTER — Other Ambulatory Visit: Payer: Self-pay | Admitting: Family Medicine

## 2020-12-15 DIAGNOSIS — E1165 Type 2 diabetes mellitus with hyperglycemia: Secondary | ICD-10-CM

## 2020-12-17 DIAGNOSIS — M9903 Segmental and somatic dysfunction of lumbar region: Secondary | ICD-10-CM | POA: Diagnosis not present

## 2020-12-17 DIAGNOSIS — M5126 Other intervertebral disc displacement, lumbar region: Secondary | ICD-10-CM | POA: Diagnosis not present

## 2020-12-19 DIAGNOSIS — Z419 Encounter for procedure for purposes other than remedying health state, unspecified: Secondary | ICD-10-CM | POA: Diagnosis not present

## 2020-12-31 DIAGNOSIS — M5126 Other intervertebral disc displacement, lumbar region: Secondary | ICD-10-CM | POA: Diagnosis not present

## 2020-12-31 DIAGNOSIS — M9903 Segmental and somatic dysfunction of lumbar region: Secondary | ICD-10-CM | POA: Diagnosis not present

## 2021-01-13 DIAGNOSIS — M7661 Achilles tendinitis, right leg: Secondary | ICD-10-CM | POA: Diagnosis not present

## 2021-01-17 ENCOUNTER — Other Ambulatory Visit: Payer: Self-pay | Admitting: Family Medicine

## 2021-01-17 DIAGNOSIS — E1165 Type 2 diabetes mellitus with hyperglycemia: Secondary | ICD-10-CM

## 2021-01-19 DIAGNOSIS — Z419 Encounter for procedure for purposes other than remedying health state, unspecified: Secondary | ICD-10-CM | POA: Diagnosis not present

## 2021-01-25 ENCOUNTER — Other Ambulatory Visit: Payer: Self-pay | Admitting: Family Medicine

## 2021-01-25 DIAGNOSIS — E1165 Type 2 diabetes mellitus with hyperglycemia: Secondary | ICD-10-CM

## 2021-01-26 ENCOUNTER — Other Ambulatory Visit: Payer: Self-pay | Admitting: Family Medicine

## 2021-01-26 NOTE — Telephone Encounter (Signed)
Jimmy Tyler. NTBS 30 days given 12/15/20

## 2021-01-27 NOTE — Telephone Encounter (Signed)
Please call patient to schedule f/u appointment

## 2021-01-28 DIAGNOSIS — M5126 Other intervertebral disc displacement, lumbar region: Secondary | ICD-10-CM | POA: Diagnosis not present

## 2021-01-28 DIAGNOSIS — M9903 Segmental and somatic dysfunction of lumbar region: Secondary | ICD-10-CM | POA: Diagnosis not present

## 2021-01-31 NOTE — Telephone Encounter (Signed)
Appointment scheduled.

## 2021-02-02 ENCOUNTER — Encounter: Payer: Self-pay | Admitting: Family Medicine

## 2021-02-02 ENCOUNTER — Other Ambulatory Visit: Payer: Self-pay

## 2021-02-02 ENCOUNTER — Ambulatory Visit (INDEPENDENT_AMBULATORY_CARE_PROVIDER_SITE_OTHER): Payer: Medicaid Other | Admitting: Family Medicine

## 2021-02-02 VITALS — BP 134/68 | HR 98 | Temp 97.9°F | Ht 71.0 in | Wt 295.6 lb

## 2021-02-02 DIAGNOSIS — E1165 Type 2 diabetes mellitus with hyperglycemia: Secondary | ICD-10-CM

## 2021-02-02 DIAGNOSIS — I152 Hypertension secondary to endocrine disorders: Secondary | ICD-10-CM | POA: Insufficient documentation

## 2021-02-02 DIAGNOSIS — E785 Hyperlipidemia, unspecified: Secondary | ICD-10-CM | POA: Diagnosis not present

## 2021-02-02 DIAGNOSIS — E782 Mixed hyperlipidemia: Secondary | ICD-10-CM

## 2021-02-02 DIAGNOSIS — E1169 Type 2 diabetes mellitus with other specified complication: Secondary | ICD-10-CM | POA: Insufficient documentation

## 2021-02-02 DIAGNOSIS — I10 Essential (primary) hypertension: Secondary | ICD-10-CM | POA: Diagnosis not present

## 2021-02-02 LAB — BAYER DCA HB A1C WAIVED: HB A1C (BAYER DCA - WAIVED): 7.8 % — ABNORMAL HIGH (ref 4.8–5.6)

## 2021-02-02 MED ORDER — METFORMIN HCL ER 500 MG PO TB24
1000.0000 mg | ORAL_TABLET | Freq: Every evening | ORAL | 1 refills | Status: DC
Start: 2021-02-02 — End: 2021-07-13

## 2021-02-02 MED ORDER — BLOOD GLUCOSE MONITOR KIT: PACK | 0 refills | Status: DC

## 2021-02-02 MED ORDER — METOPROLOL TARTRATE 25 MG PO TABS
25.0000 mg | ORAL_TABLET | Freq: Two times a day (BID) | ORAL | 1 refills | Status: DC
Start: 2021-02-02 — End: 2021-07-13

## 2021-02-02 MED ORDER — DAPAGLIFLOZIN PROPANEDIOL 10 MG PO TABS
10.0000 mg | ORAL_TABLET | Freq: Every day | ORAL | 1 refills | Status: DC
Start: 2021-02-02 — End: 2021-07-13

## 2021-02-02 MED ORDER — TRULICITY 0.75 MG/0.5ML ~~LOC~~ SOAJ: 0.7500 mg | SUBCUTANEOUS | 2 refills | Status: DC

## 2021-02-02 MED ORDER — NEXLIZET 180-10 MG PO TABS: 1.0000 | ORAL_TABLET | Freq: Every day | ORAL | 1 refills | Status: DC

## 2021-02-02 MED ORDER — LISINOPRIL 10 MG PO TABS: 10.0000 mg | ORAL_TABLET | Freq: Every day | ORAL | 1 refills | Status: DC

## 2021-02-02 NOTE — Patient Instructions (Signed)
Please schedule your diabetic eye exam.

## 2021-02-02 NOTE — Progress Notes (Signed)
Assessment & Plan:  1. Type 2 diabetes mellitus with hyperglycemia, without long-term current use of insulin (HCC) - not at goal A1C <7, will add trulicity - Bayer DCA Hb A1c Waived, 7.8 today - encouraged healthy diet and exercise - education provided on diabetes and nutrition - encouraged to schedule diabetic eye exam - ordered a meter for him so that he can check his blood sugars at home - dapagliflozin propanediol (FARXIGA) 10 MG TABS tablet; Take 1 tablet (10 mg total) by mouth daily.  Dispense: 90 tablet; Refill: 1 - lisinopril (ZESTRIL) 10 MG tablet; Take 1 tablet (10 mg total) by mouth daily.  Dispense: 90 tablet; Refill: 1 - metFORMIN (GLUCOPHAGE-XR) 500 MG 24 hr tablet; Take 2 tablets (1,000 mg total) by mouth every evening.  Dispense: 180 tablet; Refill: 1 - Dulaglutide (TRULICITY) 0.75 MG/0.5ML SOPN; Inject 0.75 mg into the skin once a week.  Dispense: 2 mL; Refill: 2  2. Mixed hyperlipidemia - encouraged healthy diet and exercise - Lipid panel - CBC with Differential/Platelet - CMP14+EGFR - Bempedoic Acid-Ezetimibe (NEXLIZET) 180-10 MG TABS; Take 1 tablet by mouth daily.  Dispense: 90 tablet; Refill: 1  3. Essential hypertension - Well controlled on current regimen - encouraged healthy diet and exercise - lisinopril (ZESTRIL) 10 MG tablet; Take 1 tablet (10 mg total) by mouth daily.  Dispense: 90 tablet; Refill: 1 - metoprolol tartrate (LOPRESSOR) 25 MG tablet; Take 1 tablet (25 mg total) by mouth 2 (two) times daily.  Dispense: 180 tablet; Refill: 1   Return in about 3 months (around 05/04/2021) for DM.  Floy Sabina, NP Student  I personally was present during the history, physical exam, and medical decision-making activities of this service and have verified that the service and findings are accurately documented in the nurse practitioner student's note.  Deliah Boston, MSN, APRN, FNP-C [facility]   Subjective:    Patient ID: Jimmy Tyler, male    DOB: Dec 10, 1975, 45 y.o.   MRN: [id number]  Patient Care Team: Gwenlyn Fudge, FNP as PCP - General (Family Medicine) Jonelle Sidle, MD as PCP - Cardiology (Cardiology) Lanelle Bal, DO as Consulting Physician (Internal Medicine)   Chief Complaint:  Chief Complaint  Patient presents with   Diabetes   Hypertension    Check up of chronic medical conditions     HPI: Jimmy Tyler is a 45 y.o. male presenting on 02/02/2021 for Diabetes and Hypertension (Check up of chronic medical conditions )  Hypertension: He is taking amlodipine 5 mg QD, lisinopril 10 mg, and metoprolol 25 mg BID. He is not checking his blood pressures at home because he does not have a cuff.   Diabetes:  Patient presents for follow up of diabetes. Current symptoms include: hyperglycemia. Known diabetic complications: cardiovascular disease. Medication compliance: He is taking metformin 1000 mg QD and Farxiga 10 mg QD. Current diet: in general, an "unhealthy" diet. Current exercise: no regular exercise. Home blood sugar records: patient does not check sugars because he does not have a meter. Is he  on ACE inhibitor or angiotensin II receptor blocker? Yes. Is he on a statin? No, has failed 3 different types. He is followed by a podiatrist.   Hyperlipidemia: He is taking not taking omega-3 fatty acid. He is taking Nexlizet in lieu of statin due to intolerance.   New complaints: None  Social history:  Relevant past medical, surgical, family and social history reviewed and updated as indicated. Interim medical  history since our last visit reviewed.  Allergies and medications reviewed and updated.  DATA REVIEWED: CHART IN EPIC  ROS: Negative unless specifically indicated above in HPI.    Current Outpatient Medications:    amLODipine (NORVASC) 5 MG tablet, Take 1 tablet (5 mg total) by mouth daily., Disp: 90 tablet, Rfl: 3   blood glucose meter kit and supplies KIT, Dispense based on  patient and insurance preference. Use up to four times daily as directed., Disp: 1 each, Rfl: 0   Dulaglutide (TRULICITY) 0.16 WF/0.9NA SOPN, Inject 0.75 mg into the skin once a week., Disp: 2 mL, Rfl: 2   Famotidine (PEPCID PO), Take 20 mg by mouth at bedtime., Disp: , Rfl:    loperamide (IMODIUM) 2 MG capsule, Take 6-8 mg by mouth daily as needed for diarrhea or loose stools., Disp: , Rfl:    Bempedoic Acid-Ezetimibe (NEXLIZET) 180-10 MG TABS, Take 1 tablet by mouth daily., Disp: 90 tablet, Rfl: 1   dapagliflozin propanediol (FARXIGA) 10 MG TABS tablet, Take 1 tablet (10 mg total) by mouth daily., Disp: 90 tablet, Rfl: 1   lisinopril (ZESTRIL) 10 MG tablet, Take 1 tablet (10 mg total) by mouth daily., Disp: 90 tablet, Rfl: 1   metFORMIN (GLUCOPHAGE-XR) 500 MG 24 hr tablet, Take 2 tablets (1,000 mg total) by mouth every evening., Disp: 180 tablet, Rfl: 1   metoprolol tartrate (LOPRESSOR) 25 MG tablet, Take 1 tablet (25 mg total) by mouth 2 (two) times daily., Disp: 180 tablet, Rfl: 1   Allergies  Allergen Reactions   Gramineae Pollens    Statins Other (See Comments)    Body aches.  Failed 3 different ones. Body aches.  Failed 3 different ones.   Past Medical History:  Diagnosis Date   Allergy    Asthma    Diabetes mellitus without complication (HCC)    GERD (gastroesophageal reflux disease)    History of Clostridium difficile infection    History of non-ST elevation myocardial infarction (NSTEMI)    Hyperlipidemia    Hypertension    Myocardial infarction ([place]) 2014   OSA on CPAP    Testicular hypofunction    Umbilical hernia     Past Surgical History:  Procedure Laterality Date   BARIATRIC SURGERY  07/13/2013   BIOPSY  07/19/2020   Procedure: BIOPSY;  Surgeon: Eloise Harman, DO;  [facility];  Service: Endoscopy;;   COLONOSCOPY WITH PROPOFOL N/A 07/19/2020   Procedure: COLONOSCOPY WITH PROPOFOL;  Surgeon: Eloise Harman, DO;  [facility];  Service:  Endoscopy;  Laterality: N/A;  early am appt   ESOPHAGOGASTRODUODENOSCOPY (EGD) WITH PROPOFOL N/A 07/19/2020   Procedure: ESOPHAGOGASTRODUODENOSCOPY (EGD) WITH PROPOFOL;  Surgeon: Eloise Harman, DO;  [facility];  Service: Endoscopy;  Laterality: N/A;   HIATAL HERNIA REPAIR     KNEE SURGERY Left 2015   Due to meniscal tear   LEFT HEART CATH AND CORONARY ANGIOGRAPHY N/A 07/28/2020   Procedure: LEFT HEART CATH AND CORONARY ANGIOGRAPHY;  Surgeon: Leonie Man, MD;  Location: [facility];  Service: Cardiovascular;  Laterality: N/A;   NASAL SEPTUM SURGERY  03/2019   POLYPECTOMY  07/19/2020   Procedure: POLYPECTOMY;  Surgeon: Eloise Harman, DO;  [facility];  Service: Endoscopy;;    Social History   Socioeconomic History   Marital status: Married    Spouse name: Not on file   Number of children: Not on file   Years of education: Not  on file   Highest education level: Not on file  Occupational History   Not on file  Tobacco Use   Smoking status: Former    Packs/day: 1.00    Types: Cigarettes    Quit date: 02/04/2014    Years since quitting: 7.0   Smokeless tobacco: Never  Vaping Use   Vaping Use: Never used  Substance and Sexual Activity   Alcohol use: Yes    Comment: very rarely    Drug use: Never   Sexual activity: Yes    Birth control/protection: None  Other Topics Concern   Not on file  Social History Narrative   Not on file   Social Determinants of Health   Financial Resource Strain: Not on file  Food Insecurity: Not on file  Transportation Needs: Not on file  Physical Activity: Not on file  Stress: Not on file  Social Connections: Not on file  Intimate Partner Violence: Not on file        Objective:    BP 134/68   Pulse 98   Temp 97.9 F (36.6 C) (Temporal)   Ht $R'5\' 11"'pX$  (1.803 m)   Wt 134.1 kg   SpO2 96%   BMI 41.23 kg/m   Wt Readings from Last 3 Encounters:  02/02/21 134.1 kg  07/28/20 133.8 kg  07/15/20 133.8 kg     Physical Exam Vitals reviewed.  Constitutional:      General: He is not in acute distress.    Appearance: Normal appearance. He is normal weight. He is not ill-appearing, toxic-appearing or diaphoretic.  HENT:     Head: Normocephalic and atraumatic.  Eyes:     General: No scleral icterus.       Right eye: No discharge.        Left eye: No discharge.     Conjunctiva/sclera: Conjunctivae normal.  Cardiovascular:     Rate and Rhythm: Normal rate and regular rhythm.     Heart sounds: Normal heart sounds. No murmur heard.   No friction rub. No gallop.  Pulmonary:     Effort: Pulmonary effort is normal. No respiratory distress.     Breath sounds: Normal breath sounds. No stridor. No wheezing, rhonchi or rales.  Abdominal:     Palpations: Abdomen is soft.  Musculoskeletal:        General: Normal range of motion.     Cervical back: Normal range of motion.     Right lower leg: No edema.     Left lower leg: No edema.  Skin:    General: Skin is warm and dry.     Capillary Refill: Capillary refill takes less than 2 seconds.  Neurological:     Mental Status: He is alert and oriented to person, place, and time. Mental status is at baseline.  Psychiatric:        Mood and Affect: Mood normal.        Behavior: Behavior normal.        Thought Content: Thought content normal.        Judgment: Judgment normal.    Lab Results  Component Value Date   TSH 1.850 02/08/2020   Lab Results  Component Value Date   WBC 5.5 07/29/2020   HGB 13.2 07/29/2020   HCT 39.1 07/29/2020   MCV 91.1 07/29/2020   PLT 210 07/29/2020   Lab Results  Component Value Date   NA 136 07/29/2020   K 4.0 07/29/2020   CO2 28 07/29/2020   GLUCOSE 150 (H)  07/29/2020   BUN 12 07/29/2020   CREATININE 0.83 07/29/2020   BILITOT 0.4 07/28/2020   ALKPHOS 52 07/28/2020   AST 30 07/28/2020   ALT 51 (H) 07/28/2020   PROT 7.2 07/28/2020   ALBUMIN 3.8 07/28/2020   CALCIUM 8.9 07/29/2020   ANIONGAP 6 07/29/2020    Lab Results  Component Value Date   CHOL 168 07/29/2020   Lab Results  Component Value Date   HDL 36 (L) 07/29/2020   Lab Results  Component Value Date   LDLCALC 81 07/29/2020   Lab Results  Component Value Date   TRIG 254 (H) 07/29/2020   Lab Results  Component Value Date   CHOLHDL 4.7 07/29/2020   Lab Results  Component Value Date   HGBA1C 7.6 (H) 07/28/2020

## 2021-02-03 LAB — CMP14+EGFR
ALT: 44 IU/L (ref 0–44)
AST: 30 IU/L (ref 0–40)
Albumin/Globulin Ratio: 1.4 (ref 1.2–2.2)
Albumin: 4.2 g/dL (ref 4.0–5.0)
Alkaline Phosphatase: 61 IU/L (ref 44–121)
BUN/Creatinine Ratio: 22 — ABNORMAL HIGH (ref 9–20)
BUN: 18 mg/dL (ref 6–24)
Bilirubin Total: 0.4 mg/dL (ref 0.0–1.2)
CO2: 24 mmol/L (ref 20–29)
Calcium: 9.4 mg/dL (ref 8.7–10.2)
Chloride: 100 mmol/L (ref 96–106)
Creatinine, Ser: 0.83 mg/dL (ref 0.76–1.27)
Globulin, Total: 3.1 g/dL (ref 1.5–4.5)
Glucose: 217 mg/dL — ABNORMAL HIGH (ref 65–99)
Potassium: 4.2 mmol/L (ref 3.5–5.2)
Sodium: 139 mmol/L (ref 134–144)
Total Protein: 7.3 g/dL (ref 6.0–8.5)
eGFR: 109 mL/min/{1.73_m2} (ref >59)

## 2021-02-03 LAB — CBC WITH DIFFERENTIAL/PLATELET
Basophils Absolute: 0 10*3/uL (ref 0.0–0.2)
Basos: 1 %
EOS (ABSOLUTE): 0.2 10*3/uL (ref 0.0–0.4)
Eos: 3 %
Hematocrit: 40.1 % (ref 37.5–51.0)
Hemoglobin: 14 g/dL (ref 13.0–17.7)
Immature Grans (Abs): 0 10*3/uL (ref 0.0–0.1)
Immature Granulocytes: 1 %
Lymphocytes Absolute: 1.7 10*3/uL (ref 0.7–3.1)
Lymphs: 34 %
MCH: 30.8 pg (ref 26.6–33.0)
MCHC: 34.9 g/dL (ref 31.5–35.7)
MCV: 88 fL (ref 79–97)
Monocytes Absolute: 0.4 10*3/uL (ref 0.1–0.9)
Monocytes: 7 %
Neutrophils Absolute: 2.8 10*3/uL (ref 1.4–7.0)
Neutrophils: 54 %
Platelets: 232 10*3/uL (ref 150–450)
RBC: 4.54 x10E6/uL (ref 4.14–5.80)
RDW: 12.7 % (ref 11.6–15.4)
WBC: 5.1 10*3/uL (ref 3.4–10.8)

## 2021-02-03 LAB — LIPID PANEL
Chol/HDL Ratio: 3.8 ratio (ref 0.0–5.0)
Cholesterol, Total: 143 mg/dL (ref 100–199)
HDL: 38 mg/dL — ABNORMAL LOW (ref >39)
LDL Chol Calc (NIH): 59 mg/dL (ref 0–99)
Triglycerides: 295 mg/dL — ABNORMAL HIGH (ref 0–149)
VLDL Cholesterol Cal: 46 mg/dL — ABNORMAL HIGH (ref 5–40)

## 2021-02-18 DIAGNOSIS — Z419 Encounter for procedure for purposes other than remedying health state, unspecified: Secondary | ICD-10-CM | POA: Diagnosis not present

## 2021-03-04 DIAGNOSIS — M9903 Segmental and somatic dysfunction of lumbar region: Secondary | ICD-10-CM | POA: Diagnosis not present

## 2021-03-04 DIAGNOSIS — M5126 Other intervertebral disc displacement, lumbar region: Secondary | ICD-10-CM | POA: Diagnosis not present

## 2021-03-06 ENCOUNTER — Other Ambulatory Visit: Payer: Self-pay | Admitting: Family Medicine

## 2021-03-06 DIAGNOSIS — E1165 Type 2 diabetes mellitus with hyperglycemia: Secondary | ICD-10-CM

## 2021-03-06 DIAGNOSIS — I10 Essential (primary) hypertension: Secondary | ICD-10-CM

## 2021-03-21 DIAGNOSIS — Z419 Encounter for procedure for purposes other than remedying health state, unspecified: Secondary | ICD-10-CM | POA: Diagnosis not present

## 2021-03-25 DIAGNOSIS — M9903 Segmental and somatic dysfunction of lumbar region: Secondary | ICD-10-CM | POA: Diagnosis not present

## 2021-03-25 DIAGNOSIS — M5126 Other intervertebral disc displacement, lumbar region: Secondary | ICD-10-CM | POA: Diagnosis not present

## 2021-04-20 DIAGNOSIS — Z419 Encounter for procedure for purposes other than remedying health state, unspecified: Secondary | ICD-10-CM | POA: Diagnosis not present

## 2021-04-25 ENCOUNTER — Other Ambulatory Visit: Payer: Self-pay | Admitting: Nurse Practitioner

## 2021-04-25 ENCOUNTER — Telehealth: Payer: Self-pay

## 2021-04-25 MED ORDER — OSELTAMIVIR PHOSPHATE 75 MG PO CAPS: 75.0000 mg | ORAL_CAPSULE | Freq: Every day | ORAL | 0 refills | Status: DC

## 2021-04-26 ENCOUNTER — Other Ambulatory Visit: Payer: Self-pay | Admitting: Family Medicine

## 2021-04-26 DIAGNOSIS — E1165 Type 2 diabetes mellitus with hyperglycemia: Secondary | ICD-10-CM

## 2021-05-05 DIAGNOSIS — L03011 Cellulitis of right finger: Secondary | ICD-10-CM | POA: Diagnosis not present

## 2021-05-21 DIAGNOSIS — Z419 Encounter for procedure for purposes other than remedying health state, unspecified: Secondary | ICD-10-CM | POA: Diagnosis not present

## 2021-06-21 DIAGNOSIS — Z419 Encounter for procedure for purposes other than remedying health state, unspecified: Secondary | ICD-10-CM | POA: Diagnosis not present

## 2021-07-03 ENCOUNTER — Telehealth: Payer: Self-pay | Admitting: Family Medicine

## 2021-07-03 DIAGNOSIS — E1165 Type 2 diabetes mellitus with hyperglycemia: Secondary | ICD-10-CM

## 2021-07-03 DIAGNOSIS — I10 Essential (primary) hypertension: Secondary | ICD-10-CM

## 2021-07-03 DIAGNOSIS — H5213 Myopia, bilateral: Secondary | ICD-10-CM | POA: Diagnosis not present

## 2021-07-03 DIAGNOSIS — E782 Mixed hyperlipidemia: Secondary | ICD-10-CM

## 2021-07-03 NOTE — Telephone Encounter (Signed)
Left message advising requested labs ordered and to call back with any further questions or concerns.

## 2021-07-03 NOTE — Telephone Encounter (Signed)
Please add future lab order for patient. He has an appt to see PCP for DM Ck on 07/13/21 but is coming in a few days early to do labs.

## 2021-07-10 ENCOUNTER — Other Ambulatory Visit: Payer: Medicaid Other

## 2021-07-10 DIAGNOSIS — I10 Essential (primary) hypertension: Secondary | ICD-10-CM | POA: Diagnosis not present

## 2021-07-10 DIAGNOSIS — E782 Mixed hyperlipidemia: Secondary | ICD-10-CM | POA: Diagnosis not present

## 2021-07-10 DIAGNOSIS — E1165 Type 2 diabetes mellitus with hyperglycemia: Secondary | ICD-10-CM | POA: Diagnosis not present

## 2021-07-10 LAB — BAYER DCA HB A1C WAIVED: HB A1C (BAYER DCA - WAIVED): 7.9 % — ABNORMAL HIGH (ref 4.8–5.6)

## 2021-07-10 LAB — LIPID PANEL

## 2021-07-11 ENCOUNTER — Ambulatory Visit: Payer: Medicaid Other | Admitting: Family Medicine

## 2021-07-11 LAB — CBC WITH DIFFERENTIAL/PLATELET
Basophils Absolute: 0 10*3/uL (ref 0.0–0.2)
Basos: 1 %
EOS (ABSOLUTE): 0.1 10*3/uL (ref 0.0–0.4)
Eos: 3 %
Hematocrit: 43.7 % (ref 37.5–51.0)
Hemoglobin: 14.8 g/dL (ref 13.0–17.7)
Immature Grans (Abs): 0 10*3/uL (ref 0.0–0.1)
Immature Granulocytes: 0 %
Lymphocytes Absolute: 1.4 10*3/uL (ref 0.7–3.1)
Lymphs: 28 %
MCH: 30 pg (ref 26.6–33.0)
MCHC: 33.9 g/dL (ref 31.5–35.7)
MCV: 89 fL (ref 79–97)
Monocytes Absolute: 0.3 10*3/uL (ref 0.1–0.9)
Monocytes: 6 %
Neutrophils Absolute: 3.2 10*3/uL (ref 1.4–7.0)
Neutrophils: 62 %
Platelets: 247 10*3/uL (ref 150–450)
RBC: 4.93 x10E6/uL (ref 4.14–5.80)
RDW: 12.7 % (ref 11.6–15.4)
WBC: 5.1 10*3/uL (ref 3.4–10.8)

## 2021-07-11 LAB — LIPID PANEL
Chol/HDL Ratio: 4.2 ratio (ref 0.0–5.0)
Cholesterol, Total: 172 mg/dL (ref 100–199)
HDL: 41 mg/dL (ref >39)
LDL Chol Calc (NIH): 83 mg/dL (ref 0–99)
Triglycerides: 294 mg/dL — ABNORMAL HIGH (ref 0–149)
VLDL Cholesterol Cal: 48 mg/dL — ABNORMAL HIGH (ref 5–40)

## 2021-07-11 LAB — CMP14+EGFR
ALT: 38 IU/L (ref 0–44)
AST: 22 IU/L (ref 0–40)
Albumin/Globulin Ratio: 1.5 (ref 1.2–2.2)
Albumin: 4.6 g/dL (ref 4.0–5.0)
Alkaline Phosphatase: 64 IU/L (ref 44–121)
BUN/Creatinine Ratio: 15 (ref 9–20)
BUN: 14 mg/dL (ref 6–24)
Bilirubin Total: 0.5 mg/dL (ref 0.0–1.2)
CO2: 25 mmol/L (ref 20–29)
Calcium: 9.5 mg/dL (ref 8.7–10.2)
Chloride: 102 mmol/L (ref 96–106)
Creatinine, Ser: 0.92 mg/dL (ref 0.76–1.27)
Globulin, Total: 3.1 g/dL (ref 1.5–4.5)
Glucose: 168 mg/dL — ABNORMAL HIGH (ref 70–99)
Potassium: 5 mmol/L (ref 3.5–5.2)
Sodium: 140 mmol/L (ref 134–144)
Total Protein: 7.7 g/dL (ref 6.0–8.5)
eGFR: 103 mL/min/{1.73_m2} (ref >59)

## 2021-07-13 ENCOUNTER — Encounter: Payer: Self-pay | Admitting: Family Medicine

## 2021-07-13 ENCOUNTER — Ambulatory Visit (INDEPENDENT_AMBULATORY_CARE_PROVIDER_SITE_OTHER): Payer: Medicaid Other | Admitting: Family Medicine

## 2021-07-13 VITALS — BP 138/74 | HR 68 | Temp 98.1°F | Ht 71.0 in | Wt 291.2 lb

## 2021-07-13 DIAGNOSIS — E782 Mixed hyperlipidemia: Secondary | ICD-10-CM

## 2021-07-13 DIAGNOSIS — I10 Essential (primary) hypertension: Secondary | ICD-10-CM

## 2021-07-13 DIAGNOSIS — E1165 Type 2 diabetes mellitus with hyperglycemia: Secondary | ICD-10-CM

## 2021-07-13 MED ORDER — METFORMIN HCL ER 500 MG PO TB24: 1000.0000 mg | ORAL_TABLET | Freq: Every evening | ORAL | 1 refills | Status: DC

## 2021-07-13 MED ORDER — OZEMPIC (0.25 OR 0.5 MG/DOSE) 2 MG/1.5ML ~~LOC~~ SOPN: PEN_INJECTOR | SUBCUTANEOUS | 0 refills | Status: DC

## 2021-07-13 MED ORDER — NEXLIZET 180-10 MG PO TABS: 1.0000 | ORAL_TABLET | Freq: Every day | ORAL | 1 refills | Status: DC

## 2021-07-13 MED ORDER — METOPROLOL TARTRATE 25 MG PO TABS: 25.0000 mg | ORAL_TABLET | Freq: Two times a day (BID) | ORAL | 1 refills | Status: DC

## 2021-07-13 MED ORDER — LISINOPRIL 10 MG PO TABS: 10.0000 mg | ORAL_TABLET | Freq: Every day | ORAL | 1 refills | Status: DC

## 2021-07-13 MED ORDER — DAPAGLIFLOZIN PROPANEDIOL 10 MG PO TABS: 10.0000 mg | ORAL_TABLET | Freq: Every day | ORAL | 1 refills | Status: DC

## 2021-07-13 NOTE — Progress Notes (Signed)
Assessment & Plan:  1. Type 2 diabetes mellitus with hyperglycemia, without long-term current use of insulin (HCC) Lab Results  Component Value Date   HGBA1C 7.9 (H) 07/10/2021   HGBA1C 7.8 (H) 02/02/2021   HGBA1C 7.6 (H) 07/28/2020    - Diabetes is not at goal of A1c < 7. - Medications:  continue Metformin and Farxiga. Start Ozempic 0.25 mg weekly. - Home glucose monitoring: encouraged to monitor - Patient is not currently taking a statin. Patient is taking an ACE-inhibitor/ARB.  - Instruction/counseling given: discussed diet  Diabetes Health Maintenance Due  Topic Date Due   OPHTHALMOLOGY EXAM  Never done   HEMOGLOBIN A1C  01/07/2022   FOOT EXAM  02/02/2022    Lab Results  Component Value Date   LABMICR <3.0 07/13/2021   LABMICR 12.9 02/08/2020   - Microalbumin / creatinine urine ratio - dapagliflozin propanediol (FARXIGA) 10 MG TABS tablet; Take 1 tablet (10 mg total) by mouth daily.  Dispense: 90 tablet; Refill: 1 - lisinopril (ZESTRIL) 10 MG tablet; Take 1 tablet (10 mg total) by mouth daily.  Dispense: 90 tablet; Refill: 1 - metFORMIN (GLUCOPHAGE-XR) 500 MG 24 hr tablet; Take 2 tablets (1,000 mg total) by mouth every evening.  Dispense: 180 tablet; Refill: 1 - Semaglutide,0.25 or 0.5MG/DOS, (OZEMPIC, 0.25 OR 0.5 MG/DOSE,) 2 MG/1.5ML SOPN; Inject 0.25 mg into the skin once a week for 28 days, THEN 0.5 mg once a week.  Dispense: 4.5 mL; Refill: 0  2. Essential hypertension Well controlled on current regimen.  - lisinopril (ZESTRIL) 10 MG tablet; Take 1 tablet (10 mg total) by mouth daily.  Dispense: 90 tablet; Refill: 1 - metoprolol tartrate (LOPRESSOR) 25 MG tablet; Take 1 tablet (25 mg total) by mouth 2 (two) times daily.  Dispense: 180 tablet; Refill: 1  3. Mixed hyperlipidemia Well controlled on current regimen.  - Bempedoic Acid-Ezetimibe (NEXLIZET) 180-10 MG TABS; Take 1 tablet by mouth daily.  Dispense: 90 tablet; Refill: 1  4. Morbid obesity ([place]) Starting  Ozempic. - Semaglutide,0.25 or 0.5MG/DOS, (OZEMPIC, 0.25 OR 0.5 MG/DOSE,) 2 MG/1.5ML SOPN; Inject 0.25 mg into the skin once a week for 28 days, THEN 0.5 mg once a week.  Dispense: 4.5 mL; Refill: 0   Return in about 3 months (around 10/10/2021) for annual physical & DM.  Hendricks Limes, MSN, APRN, FNP-C [facility]  Subjective:    Patient ID: Jimmy Tyler, male    DOB: 04/30/1976, 46 y.o.   MRN: [id number]  Patient Care Team: Loman Brooklyn, FNP as PCP - General (Family Medicine) Satira Sark, MD as PCP - Cardiology (Cardiology) Eloise Harman, DO as Consulting Physician (Internal Medicine) Harlen Labs, MD as Referring Physician (Optometry)   Chief Complaint:  Chief Complaint  Patient presents with   Diabetes    3 month follow up     HPI: CAESAR MANNELLA is a 46 y.o. male presenting on 07/13/2021 for Diabetes (3 month follow up )  Hypertension: He is taking amlodipine 5 mg QD, lisinopril 10 mg, and metoprolol 25 mg BID. He is not checking his blood pressures at home because he does not have a cuff.   Diabetes:  Patient presents for follow up of diabetes. Current symptoms include: hyperglycemia. Known diabetic complications: cardiovascular disease. Medication compliance: He is taking metformin 1000 mg QD and Farxiga 10 mg QD. He was started on Trulicity at our last visit, but has not been able to consistently obtain it due  to the medication being on backorder nationally. Current diet: in general, an "unhealthy" diet. Current exercise: no regular exercise. Home blood sugar records: patient does not check sugars. Is he  on ACE inhibitor or angiotensin II receptor blocker? Yes (Lisinopril). Is he on a statin? No, intolerant.  Hyperlipidemia: He is taking not taking omega-3 fatty acid. He is taking Nexlizet in lieu of statin due to intolerance.   New complaints: None   Social history:  Relevant past medical, surgical, family and social  history reviewed and updated as indicated. Interim medical history since our last visit reviewed.  Allergies and medications reviewed and updated.  DATA REVIEWED: CHART IN EPIC  ROS: Negative unless specifically indicated above in HPI.    Current Outpatient Medications:    amLODipine (NORVASC) 5 MG tablet, Take 1 tablet (5 mg total) by mouth daily., Disp: 90 tablet, Rfl: 3   Bempedoic Acid-Ezetimibe (NEXLIZET) 180-10 MG TABS, Take 1 tablet by mouth daily., Disp: 90 tablet, Rfl: 1   blood glucose meter kit and supplies KIT, Dispense based on patient and insurance preference. Use up to four times daily as directed., Disp: 1 each, Rfl: 0   dapagliflozin propanediol (FARXIGA) 10 MG TABS tablet, Take 1 tablet (10 mg total) by mouth daily., Disp: 90 tablet, Rfl: 1   Famotidine (PEPCID PO), Take 20 mg by mouth at bedtime., Disp: , Rfl:    lisinopril (ZESTRIL) 10 MG tablet, Take 1 tablet (10 mg total) by mouth daily., Disp: 90 tablet, Rfl: 1   loperamide (IMODIUM) 2 MG capsule, Take 6-8 mg by mouth daily as needed for diarrhea or loose stools., Disp: , Rfl:    metFORMIN (GLUCOPHAGE-XR) 500 MG 24 hr tablet, Take 2 tablets (1,000 mg total) by mouth every evening. (Patient taking differently: Take 1,000 mg by mouth 2 (two) times daily at 8 am and 10 pm.), Disp: 180 tablet, Rfl: 1   metoprolol tartrate (LOPRESSOR) 25 MG tablet, Take 1 tablet (25 mg total) by mouth 2 (two) times daily., Disp: 180 tablet, Rfl: 1   TRULICITY 0.14 DC/3.0DT SOPN, INJECT 0.75MG INTO THE SKIN ONCE A WEEK (Patient not taking: Reported on 07/13/2021), Disp: 4 mL, Rfl: 0   Allergies  Allergen Reactions   Gramineae Pollens    Statins Other (See Comments)    Body aches.  Failed 3 different ones. Body aches.  Failed 3 different ones. Body aches.  Failed 3 different ones. Other reaction(s): Other (See Comments) Body aches.  Failed 3 different ones. Body aches.  Failed 3 different ones.   Past Medical History:  Diagnosis Date    Allergy    Asthma    Diabetes mellitus without complication (HCC)    GERD (gastroesophageal reflux disease)    History of Clostridium difficile infection    History of non-ST elevation myocardial infarction (NSTEMI)    Hyperlipidemia    Hypertension    Myocardial infarction ([place]) 2014   OSA on CPAP    Testicular hypofunction    Umbilical hernia     Past Surgical History:  Procedure Laterality Date   BARIATRIC SURGERY  07/13/2013   BIOPSY  07/19/2020   Procedure: BIOPSY;  Surgeon: Eloise Harman, DO;  [facility];  Service: Endoscopy;;   COLONOSCOPY WITH PROPOFOL N/A 07/19/2020   Procedure: COLONOSCOPY WITH PROPOFOL;  Surgeon: Eloise Harman, DO;  [facility];  Service: Endoscopy;  Laterality: N/A;  early am appt   ESOPHAGOGASTRODUODENOSCOPY (EGD) WITH PROPOFOL N/A 07/19/2020   Procedure: ESOPHAGOGASTRODUODENOSCOPY (  EGD) WITH PROPOFOL;  Surgeon: Eloise Harman, DO;  [facility];  Service: Endoscopy;  Laterality: N/A;   HIATAL HERNIA REPAIR     KNEE SURGERY Left 2015   Due to meniscal tear   LEFT HEART CATH AND CORONARY ANGIOGRAPHY N/A 07/28/2020   Procedure: LEFT HEART CATH AND CORONARY ANGIOGRAPHY;  Surgeon: Leonie Man, MD;  Location: [place] [facility];  Service: Cardiovascular;  Laterality: N/A;   NASAL SEPTUM SURGERY  03/2019   POLYPECTOMY  07/19/2020   Procedure: POLYPECTOMY;  Surgeon: Eloise Harman, DO;  [facility];  Service: Endoscopy;;    Social History   Socioeconomic History   Marital status: Married    Spouse name: Not on file   Number of children: Not on file   Years of education: Not on file   Highest education level: Not on file  Occupational History   Not on file  Tobacco Use   Smoking status: Former    Packs/day: 1.00    Types: Cigarettes    Quit date: 02/04/2014    Years since quitting: 7.4   Smokeless tobacco: Never  Vaping Use   Vaping Use: Never used  Substance and Sexual Activity    Alcohol use: Yes    Comment: very rarely    Drug use: Never   Sexual activity: Yes    Birth control/protection: None  Other Topics Concern   Not on file  Social History Narrative   Not on file   Social Determinants of Health   Financial Resource Strain: Not on file  Food Insecurity: Not on file  Transportation Needs: Not on file  Physical Activity: Not on file  Stress: Not on file  Social Connections: Not on file  Intimate Partner Violence: Not on file        Objective:    BP 138/74    Pulse 68    Temp 98.1 F (36.7 C) (Temporal)    Ht 5' 11" (1.803 m)    Wt 291 lb 3.2 oz (132.1 kg)    SpO2 95%    BMI 40.61 kg/m   Wt Readings from Last 3 Encounters:  07/13/21 291 lb 3.2 oz (132.1 kg)  02/02/21 295 lb 9.6 oz (134.1 kg)  07/28/20 295 lb (133.8 kg)    Physical Exam Vitals reviewed.  Constitutional:      General: He is not in acute distress.    Appearance: Normal appearance. He is morbidly obese. He is not ill-appearing, toxic-appearing or diaphoretic.  HENT:     Head: Normocephalic and atraumatic.  Eyes:     General: No scleral icterus.       Right eye: No discharge.        Left eye: No discharge.     Conjunctiva/sclera: Conjunctivae normal.  Cardiovascular:     Rate and Rhythm: Normal rate and regular rhythm.     Heart sounds: Normal heart sounds. No murmur heard.   No friction rub. No gallop.  Pulmonary:     Effort: Pulmonary effort is normal. No respiratory distress.     Breath sounds: Normal breath sounds. No stridor. No wheezing, rhonchi or rales.  Abdominal:     Palpations: Abdomen is soft.  Musculoskeletal:        General: Normal range of motion.     Cervical back: Normal range of motion.     Right lower leg: No edema.     Left lower leg: No edema.  Skin:    General:  Skin is warm and dry.     Capillary Refill: Capillary refill takes less than 2 seconds.  Neurological:     Mental Status: He is alert and oriented to person, place, and time. Mental  status is at baseline.  Psychiatric:        Mood and Affect: Mood normal.        Behavior: Behavior normal.        Thought Content: Thought content normal.        Judgment: Judgment normal.    Lab Results  Component Value Date   TSH 1.850 02/08/2020   Lab Results  Component Value Date   WBC 5.1 07/10/2021   HGB 14.8 07/10/2021   HCT 43.7 07/10/2021   MCV 89 07/10/2021   PLT 247 07/10/2021   Lab Results  Component Value Date   NA 140 07/10/2021   K 5.0 07/10/2021   CO2 25 07/10/2021   GLUCOSE 168 (H) 07/10/2021   BUN 14 07/10/2021   CREATININE 0.92 07/10/2021   BILITOT 0.5 07/10/2021   ALKPHOS 64 07/10/2021   AST 22 07/10/2021   ALT 38 07/10/2021   PROT 7.7 07/10/2021   ALBUMIN 4.6 07/10/2021   CALCIUM 9.5 07/10/2021   ANIONGAP 6 07/29/2020   EGFR 103 07/10/2021   Lab Results  Component Value Date   CHOL 172 07/10/2021   Lab Results  Component Value Date   HDL 41 07/10/2021   Lab Results  Component Value Date   LDLCALC 83 07/10/2021   Lab Results  Component Value Date   TRIG 294 (H) 07/10/2021   Lab Results  Component Value Date   CHOLHDL 4.2 07/10/2021   Lab Results  Component Value Date   HGBA1C 7.9 (H) 07/10/2021

## 2021-07-14 LAB — MICROALBUMIN / CREATININE URINE RATIO
Creatinine, Urine: 63.8 mg/dL
Microalb/Creat Ratio: <5 mg/g creat (ref 0–29)
Microalbumin, Urine: <3 ug/mL

## 2021-07-16 ENCOUNTER — Encounter: Payer: Self-pay | Admitting: Family Medicine

## 2021-07-17 ENCOUNTER — Telehealth: Payer: Self-pay

## 2021-07-17 NOTE — Telephone Encounter (Signed)
Message from Plan  Approved.  Rx #: [id number]  Ozempic (0.25 or 0.5 MG/DOSE) 2MG /1.5ML pen-injectors   This drug has been approved.  Approved quantity: 1.5 units per 42 day(s). The drug has been approved from 07/03/2021 to 07/17/2022.  Please call the pharmacy to process your prescription claim. Generic or biosimilar substitution may be required when available and preferred on the formulary.  [facility] notified of approval

## 2021-07-17 NOTE — Telephone Encounter (Signed)
Sent to plan and awaiting response  Jimmy Tyler   (Key: [id number])  Rx #: [id number] Ozempic (0.25 or 0.5 MG/DOSE) 2MG /1.5ML pen-injectors   Form  [employer] Prior Authorization Request Form [phone] NCPDP)

## 2021-07-19 DIAGNOSIS — Z419 Encounter for procedure for purposes other than remedying health state, unspecified: Secondary | ICD-10-CM | POA: Diagnosis not present

## 2021-08-02 ENCOUNTER — Other Ambulatory Visit: Payer: Self-pay | Admitting: Cardiology

## 2021-08-18 ENCOUNTER — Other Ambulatory Visit: Payer: Self-pay | Admitting: Cardiology

## 2021-08-19 DIAGNOSIS — Z419 Encounter for procedure for purposes other than remedying health state, unspecified: Secondary | ICD-10-CM | POA: Diagnosis not present

## 2021-09-14 ENCOUNTER — Telehealth: Payer: Self-pay | Admitting: Family Medicine

## 2021-09-14 DIAGNOSIS — E1165 Type 2 diabetes mellitus with hyperglycemia: Secondary | ICD-10-CM

## 2021-09-14 MED ORDER — OZEMPIC (0.25 OR 0.5 MG/DOSE) 2 MG/1.5ML ~~LOC~~ SOPN: 0.5000 mg | PEN_INJECTOR | SUBCUTANEOUS | 2 refills | Status: DC

## 2021-09-14 NOTE — Telephone Encounter (Signed)
Pt needs a new rx for Semaglutide,0.25 or 0.'5MG'$ /DOS, (OZEMPIC, 0.25 OR 0.5 MG/DOSE,) 2 MG/1.5ML SOPN for MAY.  ? ?Pt wants to go up to 5.0 dosage for a month supply instead of 2 weeks. He said Jimmy Tyler would understand.  ? ?Use [facility] ?

## 2021-09-14 NOTE — Telephone Encounter (Signed)
Rx sent 

## 2021-09-18 DIAGNOSIS — Z419 Encounter for procedure for purposes other than remedying health state, unspecified: Secondary | ICD-10-CM | POA: Diagnosis not present

## 2021-10-11 ENCOUNTER — Encounter: Payer: Self-pay | Admitting: Family Medicine

## 2021-10-11 ENCOUNTER — Ambulatory Visit (INDEPENDENT_AMBULATORY_CARE_PROVIDER_SITE_OTHER): Payer: Medicaid Other | Admitting: Family Medicine

## 2021-10-11 VITALS — BP 139/81 | HR 66 | Temp 97.8°F | Ht 71.0 in | Wt 290.0 lb

## 2021-10-11 DIAGNOSIS — Z0001 Encounter for general adult medical examination with abnormal findings: Secondary | ICD-10-CM

## 2021-10-11 DIAGNOSIS — E1165 Type 2 diabetes mellitus with hyperglycemia: Secondary | ICD-10-CM | POA: Diagnosis not present

## 2021-10-11 DIAGNOSIS — J302 Other seasonal allergic rhinitis: Secondary | ICD-10-CM

## 2021-10-11 DIAGNOSIS — E782 Mixed hyperlipidemia: Secondary | ICD-10-CM

## 2021-10-11 DIAGNOSIS — I10 Essential (primary) hypertension: Secondary | ICD-10-CM

## 2021-10-11 DIAGNOSIS — I201 Angina pectoris with documented spasm: Secondary | ICD-10-CM

## 2021-10-11 DIAGNOSIS — Z0000 Encounter for general adult medical examination without abnormal findings: Secondary | ICD-10-CM

## 2021-10-11 LAB — BAYER DCA HB A1C WAIVED: HB A1C (BAYER DCA - WAIVED): 7.5 % — ABNORMAL HIGH (ref 4.8–5.6)

## 2021-10-11 MED ORDER — AMLODIPINE BESYLATE 10 MG PO TABS: 10.0000 mg | ORAL_TABLET | Freq: Every day | ORAL | 1 refills | Status: DC

## 2021-10-11 MED ORDER — LEVOCETIRIZINE DIHYDROCHLORIDE 5 MG PO TABS: 5.0000 mg | ORAL_TABLET | Freq: Every evening | ORAL | 1 refills | Status: DC

## 2021-10-11 MED ORDER — FLUTICASONE PROPIONATE 50 MCG/ACT NA SUSP: 2.0000 | Freq: Every day | NASAL | 6 refills | Status: AC

## 2021-10-11 MED ORDER — SEMAGLUTIDE (1 MG/DOSE) 4 MG/3ML ~~LOC~~ SOPN: 1.0000 mg | PEN_INJECTOR | SUBCUTANEOUS | 2 refills | Status: DC

## 2021-10-11 NOTE — Progress Notes (Signed)
Assessment & Plan:  1. Well adult exam Preventive health education provided. - Lipid panel - CBC with Differential/Platelet - CMP14+EGFR  2. Type 2 diabetes mellitus with hyperglycemia, without long-term current use of insulin (HCC) Lab Results  Component Value Date   HGBA1C 7.5 (H) 10/11/2021   HGBA1C 7.9 (H) 07/10/2021   HGBA1C 7.8 (H) 02/02/2021    - Diabetes is not at goal of A1c < 7, but is improving. - Medications: continue current medications, with an increase in Ozempic from 0.5 mg to 1 mg weekly - Patient is currently taking a statin. Patient is taking an ACE-inhibitor/ARB.  - Instruction/counseling given: reminded to get eye exam and discussed the need for weight loss  Diabetes Health Maintenance Due  Topic Date Due   OPHTHALMOLOGY EXAM  Never done   FOOT EXAM  02/02/2022   HEMOGLOBIN A1C  04/13/2022    Lab Results  Component Value Date   LABMICR <3.0 07/13/2021   LABMICR 12.9 02/08/2020   - Lipid panel - CBC with Differential/Platelet - CMP14+EGFR - Bayer DCA Hb A1c Waived - Semaglutide, 1 MG/DOSE, 4 MG/3ML SOPN; Inject 1 mg as directed once a week.  Dispense: 3 mL; Refill: 2  3. Essential hypertension Uncontrolled. Increasing amlodipine from 5 mg to 10 mg daily. Continue metoprolol and lisinopril. - Lipid panel - CBC with Differential/Platelet - CMP14+EGFR - amLODipine (NORVASC) 10 MG tablet; Take 1 tablet (10 mg total) by mouth daily.  Dispense: 90 tablet; Refill: 1  4. Mixed hyperlipidemia Well controlled on current regimen.  - Lipid panel - CBC with Differential/Platelet - CMP14+EGFR  5. Morbid obesity ([place]) Encouraged healthy eating and exercise.  Ozempic increased from 0.5 mg to 1 mg weekly. - Lipid panel - CBC with Differential/Platelet - CMP14+EGFR - Semaglutide, 1 MG/DOSE, 4 MG/3ML SOPN; Inject 1 mg as directed once a week.  Dispense: 3 mL; Refill: 2  6. Seasonal allergies - fluticasone (FLONASE) 50 MCG/ACT nasal spray; Place 2 sprays  into both nostrils daily.  Dispense: 16 g; Refill: 6 - levocetirizine (XYZAL) 5 MG tablet; Take 1 tablet (5 mg total) by mouth every evening.  Dispense: 90 tablet; Refill: 1  7. Coronary vasospasm (HCC) Well controlled on current regimen.  - CMP14+EGFR - amLODipine (NORVASC) 10 MG tablet; Take 1 tablet (10 mg total) by mouth daily.  Dispense: 90 tablet; Refill: 1   Follow-up: Return in about 3 months (around 01/11/2022) for DM.   Hendricks Limes, MSN, APRN, FNP-C [facility]  Subjective:  Patient ID: Jimmy Tyler, male    DOB: January 13, 1976  Age: 46 y.o. MRN: [id number]  Patient Care Team: Loman Brooklyn, FNP as PCP - General (Family Medicine) Satira Sark, MD as PCP - Cardiology (Cardiology) Eloise Harman, DO as Consulting Physician (Internal Medicine) Harlen Labs, MD as Referring Physician (Optometry)   CC:  Chief Complaint  Patient presents with   Annual Exam    HPI Jimmy Tyler presents for his annual physical.  Occupation: [employer], Marital status: Divorced, Substance use: None Diet: Regular, Exercise: None Last eye exam: within the past 2-3 months Last dental exam: 2 years ago Last colonoscopy: 07/19/2020 with recommended repeat in 3 years Hepatitis C Screening: declined PSA: never Immunizations:  Tdap Vaccine: up to date  COVID-49 Vaccine: declined Pneumonia Vaccine: declined  DEPRESSION SCREENING    10/11/2021    3:00 PM 07/13/2021    3:38 PM 02/02/2021    4:10 PM 04/26/2020    8:26  AM 02/05/2020   11:17 AM  PHQ 2/9 Scores  PHQ - 2 Score 0 0 1 0 0  PHQ- 9 Score _0 0      Review of Systems  Constitutional:  Negative for chills, fever, malaise/fatigue and weight loss.  HENT:  Negative for congestion, ear discharge, ear pain, nosebleeds, sinus pain, sore throat and tinnitus.   Eyes:  Negative for blurred vision, double vision, pain, discharge and redness.  Respiratory:  Negative for cough, shortness of breath and  wheezing.   Cardiovascular:  Negative for chest pain, palpitations and leg swelling.  Gastrointestinal:  Negative for abdominal pain, constipation, diarrhea, heartburn, nausea and vomiting.  Genitourinary:  Negative for dysuria, frequency and urgency.       Denies trouble initiating a urine stream, weak stream, split stream, nocturia, and dribbling.   Musculoskeletal:  Negative for myalgias.  Skin:  Negative for rash.  Neurological:  Negative for dizziness, seizures, weakness and headaches.  Psychiatric/Behavioral:  Negative for depression, substance abuse and suicidal ideas. The patient is not nervous/anxious.     Current Outpatient Medications:    Bempedoic Acid-Ezetimibe (NEXLIZET) 180-10 MG TABS, Take 1 tablet by mouth daily., Disp: 90 tablet, Rfl: 1   dapagliflozin propanediol (FARXIGA) 10 MG TABS tablet, Take 1 tablet (10 mg total) by mouth daily., Disp: 90 tablet, Rfl: 1   Famotidine (PEPCID PO), Take 20 mg by mouth at bedtime., Disp: , Rfl:    lisinopril (ZESTRIL) 10 MG tablet, Take 1 tablet (10 mg total) by mouth daily., Disp: 90 tablet, Rfl: 1   loperamide (IMODIUM) 2 MG capsule, Take 6-8 mg by mouth daily as needed for diarrhea or loose stools., Disp: , Rfl:    metFORMIN (GLUCOPHAGE-XR) 500 MG 24 hr tablet, Take 2 tablets (1,000 mg total) by mouth every evening., Disp: 180 tablet, Rfl: 1   metoprolol tartrate (LOPRESSOR) 25 MG tablet, Take 1 tablet (25 mg total) by mouth 2 (two) times daily., Disp: 180 tablet, Rfl: 1   Semaglutide,0.25 or 0.5MG/DOS, (OZEMPIC, 0.25 OR 0.5 MG/DOSE,) 2 MG/1.5ML SOPN, Inject 0.5 mg into the skin once a week., Disp: 1.5 mL, Rfl: 2   amLODipine (NORVASC) 5 MG tablet, Take 1 tablet (5 mg total) by mouth daily. (Patient not taking: Reported on 10/11/2021), Disp: 90 tablet, Rfl: 3  Allergies  Allergen Reactions   Gramineae Pollens    Statins Other (See Comments)    Body aches.  Failed 3 different ones. Body aches.  Failed 3 different ones. Body aches.   Failed 3 different ones. Other reaction(s): Other (See Comments) Body aches.  Failed 3 different ones. Body aches.  Failed 3 different ones.    Past Medical History:  Diagnosis Date   Allergy    Asthma    Diabetes mellitus without complication (HCC)    GERD (gastroesophageal reflux disease)    History of Clostridium difficile infection    History of non-ST elevation myocardial infarction (NSTEMI)    Hyperlipidemia    Hypertension    Myocardial infarction ([place]) 2014   OSA on CPAP    Testicular hypofunction    Umbilical hernia     Past Surgical History:  Procedure Laterality Date   BARIATRIC SURGERY  07/13/2013   BIOPSY  07/19/2020   Procedure: BIOPSY;  Surgeon: Eloise Harman, DO;  [facility];  Service: Endoscopy;;   COLONOSCOPY WITH PROPOFOL N/A 07/19/2020   Procedure: COLONOSCOPY WITH PROPOFOL;  Surgeon: Eloise Harman, DO;  [facility];  Service: Endoscopy;  Laterality: N/A;  early am appt   ESOPHAGOGASTRODUODENOSCOPY (EGD) WITH PROPOFOL N/A 07/19/2020   Procedure: ESOPHAGOGASTRODUODENOSCOPY (EGD) WITH PROPOFOL;  Surgeon: Eloise Harman, DO;  [facility];  Service: Endoscopy;  Laterality: N/A;   HIATAL HERNIA REPAIR     KNEE SURGERY Left 2015   Due to meniscal tear   LEFT HEART CATH AND CORONARY ANGIOGRAPHY N/A 07/28/2020   Procedure: LEFT HEART CATH AND CORONARY ANGIOGRAPHY;  Surgeon: Leonie Man, MD;  Location: [place] [facility];  Service: Cardiovascular;  Laterality: N/A;   NASAL SEPTUM SURGERY  03/2019   POLYPECTOMY  07/19/2020   Procedure: POLYPECTOMY;  Surgeon: Eloise Harman, DO;  [facility];  Service: Endoscopy;;    Family History  Problem Relation Age of Onset   COPD Mother    Autism Son    Skin cancer Maternal Grandmother    Heart attack Maternal Grandfather     Social History   Socioeconomic History   Marital status: Married    Spouse name: Not on file   Number of children: Not on file    Years of education: Not on file   Highest education level: Not on file  Occupational History   Not on file  Tobacco Use   Smoking status: Former    Packs/day: 1.00    Types: Cigarettes    Quit date: 02/04/2014    Years since quitting: 7.6   Smokeless tobacco: Never  Vaping Use   Vaping Use: Never used  Substance and Sexual Activity   Alcohol use: Yes    Comment: very rarely    Drug use: Never   Sexual activity: Yes    Birth control/protection: None  Other Topics Concern   Not on file  Social History Narrative   Not on file   Social Determinants of Health   Financial Resource Strain: Not on file  Food Insecurity: Not on file  Transportation Needs: Not on file  Physical Activity: Not on file  Stress: Not on file  Social Connections: Not on file  Intimate Partner Violence: Not on file      Objective:    BP 139/81   Pulse 66   Temp 97.8 F (36.6 C) (Temporal)   Ht _0  (1.803 m)   Wt 290 lb (131.5 kg)   SpO2 96%   BMI 40.45 kg/m   Wt Readings from Last 3 Encounters:  10/11/21 290 lb (131.5 kg)  07/13/21 291 lb 3.2 oz (132.1 kg)  02/02/21 295 lb 9.6 oz (134.1 kg)    Physical Exam Vitals reviewed.  Constitutional:      General: He is not in acute distress.    Appearance: Normal appearance. He is morbidly obese. He is not ill-appearing, toxic-appearing or diaphoretic.  HENT:     Head: Normocephalic and atraumatic.     Right Ear: Tympanic membrane, ear canal and external ear normal. There is no impacted cerumen.     Left Ear: Tympanic membrane, ear canal and external ear normal. There is no impacted cerumen.     Nose: Nose normal. No congestion or rhinorrhea.     Mouth/Throat:     Mouth: Mucous membranes are moist.     Pharynx: Oropharynx is clear. No oropharyngeal exudate or posterior oropharyngeal erythema.  Eyes:     General: No scleral icterus.       Right eye: No discharge.        Left eye: No discharge.  Conjunctiva/sclera: Conjunctivae normal.      Pupils: Pupils are equal, round, and reactive to light.  Neck:     Vascular: No carotid bruit.  Cardiovascular:     Rate and Rhythm: Normal rate and regular rhythm.     Heart sounds: Normal heart sounds. No murmur heard.   No friction rub. No gallop.  Pulmonary:     Effort: Pulmonary effort is normal. No respiratory distress.     Breath sounds: Normal breath sounds. No stridor. No wheezing, rhonchi or rales.  Abdominal:     General: Abdomen is flat. Bowel sounds are normal. There is no distension.     Palpations: Abdomen is soft. There is no hepatomegaly, splenomegaly or mass.     Tenderness: There is no abdominal tenderness. There is no guarding or rebound.     Hernia: No hernia is present.  Musculoskeletal:        General: Normal range of motion.     Cervical back: Normal range of motion and neck supple. No rigidity. No muscular tenderness.     Right lower leg: No edema.     Left lower leg: No edema.  Lymphadenopathy:     Cervical: No cervical adenopathy.  Skin:    General: Skin is warm and dry.     Capillary Refill: Capillary refill takes less than 2 seconds.  Neurological:     General: No focal deficit present.     Mental Status: He is alert and oriented to person, place, and time. Mental status is at baseline.  Psychiatric:        Mood and Affect: Mood normal.        Behavior: Behavior normal.        Thought Content: Thought content normal.        Judgment: Judgment normal.    Lab Results  Component Value Date   TSH 1.850 02/08/2020   Lab Results  Component Value Date   WBC 5.1 07/10/2021   HGB 14.8 07/10/2021   HCT 43.7 07/10/2021   MCV 89 07/10/2021   PLT 247 07/10/2021   Lab Results  Component Value Date   NA 140 07/10/2021   K 5.0 07/10/2021   CO2 25 07/10/2021   GLUCOSE 168 (H) 07/10/2021   BUN 14 07/10/2021   CREATININE 0.92 07/10/2021   BILITOT 0.5 07/10/2021   ALKPHOS 64 07/10/2021   AST 22 07/10/2021   ALT 38 07/10/2021   PROT 7.7  07/10/2021   ALBUMIN 4.6 07/10/2021   CALCIUM 9.5 07/10/2021   ANIONGAP 6 07/29/2020   EGFR 103 07/10/2021   Lab Results  Component Value Date   CHOL 172 07/10/2021   Lab Results  Component Value Date   HDL 41 07/10/2021   Lab Results  Component Value Date   LDLCALC 83 07/10/2021   Lab Results  Component Value Date   TRIG 294 (H) 07/10/2021   Lab Results  Component Value Date   CHOLHDL 4.2 07/10/2021   Lab Results  Component Value Date   HGBA1C 7.9 (H) 07/10/2021

## 2021-10-12 ENCOUNTER — Encounter: Payer: Self-pay | Admitting: Family Medicine

## 2021-10-12 LAB — CBC WITH DIFFERENTIAL/PLATELET
Basophils Absolute: 0 10*3/uL (ref 0.0–0.2)
Basos: 1 %
EOS (ABSOLUTE): 0.2 10*3/uL (ref 0.0–0.4)
Eos: 3 %
Hematocrit: 40.9 % (ref 37.5–51.0)
Hemoglobin: 14.7 g/dL (ref 13.0–17.7)
Immature Grans (Abs): 0 10*3/uL (ref 0.0–0.1)
Immature Granulocytes: 0 %
Lymphocytes Absolute: 1.8 10*3/uL (ref 0.7–3.1)
Lymphs: 31 %
MCH: 31.5 pg (ref 26.6–33.0)
MCHC: 35.9 g/dL — ABNORMAL HIGH (ref 31.5–35.7)
MCV: 88 fL (ref 79–97)
Monocytes Absolute: 0.4 10*3/uL (ref 0.1–0.9)
Monocytes: 7 %
Neutrophils Absolute: 3.4 10*3/uL (ref 1.4–7.0)
Neutrophils: 58 %
Platelets: 260 10*3/uL (ref 150–450)
RBC: 4.66 x10E6/uL (ref 4.14–5.80)
RDW: 12.9 % (ref 11.6–15.4)
WBC: 5.9 10*3/uL (ref 3.4–10.8)

## 2021-10-12 LAB — LIPID PANEL
Chol/HDL Ratio: 4.7 ratio (ref 0.0–5.0)
Cholesterol, Total: 164 mg/dL (ref 100–199)
HDL: 35 mg/dL — ABNORMAL LOW (ref >39)
LDL Chol Calc (NIH): 71 mg/dL (ref 0–99)
Triglycerides: 364 mg/dL — ABNORMAL HIGH (ref 0–149)
VLDL Cholesterol Cal: 58 mg/dL — ABNORMAL HIGH (ref 5–40)

## 2021-10-12 LAB — CMP14+EGFR
ALT: 55 IU/L — ABNORMAL HIGH (ref 0–44)
AST: 39 IU/L (ref 0–40)
Albumin/Globulin Ratio: 1.6 (ref 1.2–2.2)
Albumin: 4.7 g/dL (ref 4.0–5.0)
Alkaline Phosphatase: 61 IU/L (ref 44–121)
BUN/Creatinine Ratio: 12 (ref 9–20)
BUN: 14 mg/dL (ref 6–24)
Bilirubin Total: 0.3 mg/dL (ref 0.0–1.2)
CO2: 25 mmol/L (ref 20–29)
Calcium: 9.3 mg/dL (ref 8.7–10.2)
Chloride: 100 mmol/L (ref 96–106)
Creatinine, Ser: 1.13 mg/dL (ref 0.76–1.27)
Globulin, Total: 2.9 g/dL (ref 1.5–4.5)
Glucose: 131 mg/dL — ABNORMAL HIGH (ref 70–99)
Potassium: 4.5 mmol/L (ref 3.5–5.2)
Sodium: 140 mmol/L (ref 134–144)
Total Protein: 7.6 g/dL (ref 6.0–8.5)
eGFR: 80 mL/min/{1.73_m2} (ref >59)

## 2021-10-19 DIAGNOSIS — Z419 Encounter for procedure for purposes other than remedying health state, unspecified: Secondary | ICD-10-CM | POA: Diagnosis not present

## 2021-11-03 ENCOUNTER — Encounter: Payer: Self-pay | Admitting: Family Medicine

## 2021-11-03 ENCOUNTER — Ambulatory Visit (INDEPENDENT_AMBULATORY_CARE_PROVIDER_SITE_OTHER): Payer: Medicaid Other | Admitting: Family Medicine

## 2021-11-03 VITALS — BP 132/77 | HR 78 | Temp 97.4°F | Ht 71.0 in | Wt 287.2 lb

## 2021-11-03 DIAGNOSIS — G4733 Obstructive sleep apnea (adult) (pediatric): Secondary | ICD-10-CM

## 2021-11-03 NOTE — Patient Instructions (Addendum)
[facility] - Dr. Rexene Alberts Phone: [phone] DME company

## 2021-11-03 NOTE — Progress Notes (Unsigned)
Assessment & Plan:  1. Obstructive sleep apnea syndrome Provided patient with name and # for neurologist who prescribes his CPAP and supplies so that he may call and get new supplies.   Follow up plan: Return as scheduled.  Hendricks Limes, MSN, APRN, FNP-C [facility]  Subjective:   Patient ID: Jimmy Tyler, male    DOB: 1975-08-03, 45 y.o.   MRN: [id number]  HPI: Jimmy Tyler is a 46 y.o. male presenting on 11/03/2021 for CPAP supplies  Patient is in need of CPAP supplies. He last saw Dr. Rexene Alberts, neurologist, on 06/28/2020 who ordered a new CPAP and supplies. Patient did not remember who he saw to know who to contact.    ROS: Negative unless specifically indicated above in HPI.   Relevant past medical history reviewed and updated as indicated.   Allergies and medications reviewed and updated.   Current Outpatient Medications:    amLODipine (NORVASC) 10 MG tablet, Take 1 tablet (10 mg total) by mouth daily., Disp: 90 tablet, Rfl: 1   Bempedoic Acid-Ezetimibe (NEXLIZET) 180-10 MG TABS, Take 1 tablet by mouth daily., Disp: 90 tablet, Rfl: 1   dapagliflozin propanediol (FARXIGA) 10 MG TABS tablet, Take 1 tablet (10 mg total) by mouth daily., Disp: 90 tablet, Rfl: 1   Famotidine (PEPCID PO), Take 20 mg by mouth at bedtime., Disp: , Rfl:    fluticasone (FLONASE) 50 MCG/ACT nasal spray, Place 2 sprays into both nostrils daily., Disp: 16 g, Rfl: 6   levocetirizine (XYZAL) 5 MG tablet, Take 1 tablet (5 mg total) by mouth every evening., Disp: 90 tablet, Rfl: 1   lisinopril (ZESTRIL) 10 MG tablet, Take 1 tablet (10 mg total) by mouth daily., Disp: 90 tablet, Rfl: 1   loperamide (IMODIUM) 2 MG capsule, Take 6-8 mg by mouth daily as needed for diarrhea or loose stools., Disp: , Rfl:    metFORMIN (GLUCOPHAGE-XR) 500 MG 24 hr tablet, Take 2 tablets (1,000 mg total) by mouth every evening., Disp: 180 tablet, Rfl: 1   metoprolol tartrate (LOPRESSOR) 25 MG tablet,  Take 1 tablet (25 mg total) by mouth 2 (two) times daily., Disp: 180 tablet, Rfl: 1   Semaglutide, 1 MG/DOSE, 4 MG/3ML SOPN, Inject 1 mg as directed once a week., Disp: 3 mL, Rfl: 2  Allergies  Allergen Reactions   Gramineae Pollens    Statins Other (See Comments)    Body aches.  Failed 3 different ones. Body aches.  Failed 3 different ones. Body aches.  Failed 3 different ones. Other reaction(s): Other (See Comments) Body aches.  Failed 3 different ones. Body aches.  Failed 3 different ones.    Objective:   BP 132/77   Pulse 78   Temp (!) 97.4 F (36.3 C) (Temporal)   Ht '5\' 11"'$  (1.803 m)   Wt 287 lb 3.2 oz (130.3 kg)   SpO2 97%   BMI 40.06 kg/m    Physical Exam Vitals reviewed.  Constitutional:      General: He is not in acute distress.    Appearance: Normal appearance. He is not ill-appearing, toxic-appearing or diaphoretic.  HENT:     Head: Normocephalic and atraumatic.  Eyes:     General: No scleral icterus.       Right eye: No discharge.        Left eye: No discharge.     Conjunctiva/sclera: Conjunctivae normal.  Cardiovascular:     Rate and Rhythm: Normal rate.  Pulmonary:  Effort: Pulmonary effort is normal. No respiratory distress.  Musculoskeletal:        General: Normal range of motion.     Cervical back: Normal range of motion.  Skin:    General: Skin is warm and dry.  Neurological:     Mental Status: He is alert and oriented to person, place, and time. Mental status is at baseline.  Psychiatric:        Mood and Affect: Mood normal.        Behavior: Behavior normal.        Thought Content: Thought content normal.        Judgment: Judgment normal.

## 2021-11-06 ENCOUNTER — Encounter: Payer: Self-pay | Admitting: Family Medicine

## 2021-11-13 ENCOUNTER — Telehealth: Payer: Self-pay | Admitting: Neurology

## 2021-11-13 DIAGNOSIS — G4733 Obstructive sleep apnea (adult) (pediatric): Secondary | ICD-10-CM

## 2021-11-13 NOTE — Telephone Encounter (Signed)
Pt said was never contacted about getting CPAP supplies. Would like a call from the nurse. At work can leave a vm.  Scheduled pt appt on 02/15/22.

## 2021-11-15 NOTE — Telephone Encounter (Signed)
I called pt.  He was asking about DME.  Looks like order was not sent (that I can see).  He did not call back.  He is needing now new FM/[employer].  I relayed that last seen 06-2020.  TOC from [place].  I relayed that will send message.  He may need appt and then sleep study (to re evaluate) he stated last one done 6-7 yrs ago, he thought.  [facility] would take his Medicaid insurance.  He was ok to come to [place].  I told him once messaged back then would let him know.  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  May LM or call after 1500.  He appreciated call back.

## 2021-11-15 NOTE — Telephone Encounter (Signed)
Pt has called back stating he simply wants a call back from RN with the name of the DME, he understands he has to have the f/u.  Pt declined wait list, pt declined having phone rep look to see if another NP could see him before current appointment, pt is asking that a vm be left if he is unavailable when RN calls to provide the DME name.

## 2021-11-16 NOTE — Telephone Encounter (Signed)
New order placed.  To send to [facility].

## 2021-11-16 NOTE — Telephone Encounter (Signed)
Okay to send new supply order to DME of choice.

## 2021-11-16 NOTE — Addendum Note (Signed)
Addended by: Brandon Melnick on: 11/16/2021 09:52 AM   Modules accepted: Orders

## 2021-11-18 DIAGNOSIS — Z419 Encounter for procedure for purposes other than remedying health state, unspecified: Secondary | ICD-10-CM | POA: Diagnosis not present

## 2021-12-19 ENCOUNTER — Telehealth: Payer: Self-pay | Admitting: Neurology

## 2021-12-19 DIAGNOSIS — Z419 Encounter for procedure for purposes other than remedying health state, unspecified: Secondary | ICD-10-CM | POA: Diagnosis not present

## 2021-12-19 NOTE — Telephone Encounter (Signed)
Pt said, having trouble getting CPAP supplies. Going back and forth with  DME company. Company determine need a face to face appt. Would like a call from the nurse.

## 2021-12-19 NOTE — Telephone Encounter (Signed)
Patient will have to be seen. He has an appt with megan in September but he hasn't been seen since early 2022 so we cannot provide orders without an appt. Dr Rexene Alberts has an opening at 1045 on 12/26/21 next week. Please call patient and offer this. Thanks!

## 2021-12-20 ENCOUNTER — Other Ambulatory Visit: Payer: Self-pay | Admitting: Family Medicine

## 2021-12-20 DIAGNOSIS — E1165 Type 2 diabetes mellitus with hyperglycemia: Secondary | ICD-10-CM

## 2021-12-20 NOTE — Telephone Encounter (Signed)
I see pt has been scheduled for 12/26/21 with Dr Rexene Alberts. I d/w Glenard Haring that patient would have to bring his machine and cord in ahead of time for data download.

## 2021-12-20 NOTE — Telephone Encounter (Signed)
Pt did not accept appointment that was offered but pt said he could do a video visit for that time if Dr. Rexene Alberts would be okay with that. Also informed pt that he would have to bring his CPAP machine and power cord into the office between the hours of 8 and 5 for download

## 2021-12-26 ENCOUNTER — Encounter: Payer: Self-pay | Admitting: *Deleted

## 2021-12-26 ENCOUNTER — Telehealth (INDEPENDENT_AMBULATORY_CARE_PROVIDER_SITE_OTHER): Payer: Medicaid Other | Admitting: Neurology

## 2021-12-26 DIAGNOSIS — G4733 Obstructive sleep apnea (adult) (pediatric): Secondary | ICD-10-CM

## 2021-12-26 DIAGNOSIS — Z9989 Dependence on other enabling machines and devices: Secondary | ICD-10-CM

## 2021-12-26 NOTE — Patient Instructions (Signed)
1. I have sent an order for supplies to [facility].  2. Follow-up in clinic for face-to-face visit or video visit in 1 year. 4. Call or email through My Chart for any interim questions or concerns.

## 2021-12-26 NOTE — Progress Notes (Signed)
Interim history:   Jimmy Tyler is a 46 year old right-handed gentleman with an underlying medical history of allergies, asthma, diabetes, reflux disease, history of non-STEMI, hypertension, hyperlipidemia, testicular hypofunction, umbilical hernia and severe obesity with a BMI of over 40, s/p bariatric surgery in 2015, who presents for follow-up consultation of his obstructive sleep apnea, treated with CPAP.  The patient is unaccompanied today and presents for a VV through [place].  The patient is located at his home, I am located in my office at [facility].   I first met him at the request of his primary care nurse practitioner on 06/28/2020, at which time he reported a prior diagnosis of sleep apnea and he was on CPAP consistently with excellent compliance.  He had moved from [place] to [place] and testing was done several years prior.  He was advised to register his Philips Respironics CPAP machine to see if he was eligible for replacement from the recall.  In the interim, I wrote for new supplies in June 2023.  Today, 12/26/2021: I reviewed his CPAP compliance data from 11/20/2021 through 12/19/2021, which is a total of 30 days, during which time he used his machine every night with percent use days greater than 4 hours at 100%, indicating superb compliance with an average usage of 8 hours and 18 minutes, residual AHI 0.5/h, pressure at 16 cm and leak not detected.  He reports doing well with his machine, it is about 48 years old, sleep testing was in [place] about 7 or 8 years ago.  He has not actually registered his machine for a possible replacement from the recall but reports that this machine is working well.  He does need new supplies and is overdue for supplies.  We had sent an order to [facility] but he needed a up-to-date visit. He is using a Quatro full facemask but would be amenable to considering a different style as he has to tighten the mask quite a bit.  He takes allergy  medication, switch from Zyrtec to Xyzal.  Previously:   (He) was previously diagnosed with obstructive sleep apnea several years ago.  He reports that his original diagnosis was about 12 to 13 years ago.  He is on his third CPAP machine.  He has a Teacher, English as a foreign language.  He is aware of the recall on the machines but has not registered his machine.  He denies any high heat exposure or using a ozone based cleaning machine.  He has used a Art therapist full facemask.  Set up date according to online information from the compliance data website was 03/20/2019.  He does not typically go without using his CPAP.  He goes to bed generally around 9 or 10 PM and rise time is generally between 530 and 6 AM.  He does not have night to night nocturia.  His compliance data from 05/29/2020 through 06/27/2020 was reviewed.  He used his machine every night, percent use days greater than 4 hours was 100%, indicating superb compliance with an average usage of 8 hours and 18 minutes, residual AHI 0.5/h, leak 0 seconds, CPAP pressure of 16 cm.  Prior sleep study results are not available for my review today.  He moved from [place] to [place] in July 2021.  He lives with his wife and 87-year-old son, they have 3 dogs in the household.   I reviewed your office note from 04/26/2020.  His Epworth sleepiness score is 9 out of 24, fatigue severity score  is 51 out of 63.  He reports having had gastric sleeve some 6 years ago and was able to lose about 75 pounds but gained most of his weight back.  He reports fatigue and tiredness.  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 He quit smoking several years ago.  He drinks alcohol rarely.  He drinks caffeine in limitation, 1 serving per day on average.  His mother has a history of sleep apnea. His Past Medical History Is Significant For: Past Medical History:  Diagnosis Date   Allergy     Asthma    Diabetes mellitus without complication ([place])    GERD (gastroesophageal reflux disease)    History of Clostridium difficile infection    History of non-ST elevation myocardial infarction (NSTEMI)    Hyperlipidemia    Hypertension    Myocardial infarction ([place]) 2014   OSA on CPAP    Testicular hypofunction    Umbilical hernia     His Past Surgical History Is Significant For: Past Surgical History:  Procedure Laterality Date   BARIATRIC SURGERY  07/13/2013   BIOPSY  07/19/2020   Procedure: BIOPSY;  Surgeon: Eloise Harman, DO;  [facility];  Service: Endoscopy;;   COLONOSCOPY WITH PROPOFOL N/A 07/19/2020   Procedure: COLONOSCOPY WITH PROPOFOL;  Surgeon: Eloise Harman, DO;  [facility];  Service: Endoscopy;  Laterality: N/A;  early am appt   ESOPHAGOGASTRODUODENOSCOPY (EGD) WITH PROPOFOL N/A 07/19/2020   Procedure: ESOPHAGOGASTRODUODENOSCOPY (EGD) WITH PROPOFOL;  Surgeon: Eloise Harman, DO;  [facility];  Service: Endoscopy;  Laterality: N/A;   HIATAL HERNIA REPAIR     KNEE SURGERY Left 2015   Due to meniscal tear   LEFT HEART CATH AND CORONARY ANGIOGRAPHY N/A 07/28/2020   Procedure: LEFT HEART CATH AND CORONARY ANGIOGRAPHY;  Surgeon: Leonie Man, MD;  Location: [facility];  Service: Cardiovascular;  Laterality: N/A;   NASAL SEPTUM SURGERY  03/2019   POLYPECTOMY  07/19/2020   Procedure: POLYPECTOMY;  Surgeon: Eloise Harman, DO;  [facility];  Service: Endoscopy;;    His Family History Is Significant For: Family History  Problem Relation Age of Onset   COPD Mother    Autism Son    Skin cancer Maternal Grandmother    Heart attack Maternal Grandfather     His Social History Is Significant For: Social History   Socioeconomic History   Marital status: Divorced    Spouse name: Not on file   Number of children: Not on file   Years of education: Not on file   Highest education level: Not on file   Occupational History   Not on file  Tobacco Use   Smoking status: Former    Packs/day: 1.00    Types: Cigarettes    Quit date: 02/04/2014    Years since quitting: 7.8   Smokeless tobacco: Never  Vaping Use   Vaping Use: Never used  Substance and Sexual Activity   Alcohol use: Yes    Comment: very rarely    Drug use: Never   Sexual activity: Yes    Birth control/protection: None  Other Topics Concern   Not on file  Social History Narrative   Not on file   Social Determinants of Health   Financial Resource Strain: Not on file  Food Insecurity: Not on file  Transportation Needs: Not on  file  Physical Activity: Not on file  Stress: Not on file  Social Connections: Not on file    His Allergies Are:  Allergies  Allergen Reactions   Gramineae Pollens    Statins Other (See Comments)    Body aches.  Failed 3 different ones. Body aches.  Failed 3 different ones. Body aches.  Failed 3 different ones. Other reaction(s): Other (See Comments) Body aches.  Failed 3 different ones. Body aches.  Failed 3 different ones.  :   His Current Medications Are:  Outpatient Encounter Medications as of 12/26/2021  Medication Sig   amLODipine (NORVASC) 10 MG tablet Take 1 tablet (10 mg total) by mouth daily.   Bempedoic Acid-Ezetimibe (NEXLIZET) 180-10 MG TABS Take 1 tablet by mouth daily.   dapagliflozin propanediol (FARXIGA) 10 MG TABS tablet Take 1 tablet (10 mg total) by mouth daily.   Famotidine (PEPCID PO) Take 20 mg by mouth at bedtime.   fluticasone (FLONASE) 50 MCG/ACT nasal spray Place 2 sprays into both nostrils daily.   levocetirizine (XYZAL) 5 MG tablet Take 1 tablet (5 mg total) by mouth every evening.   lisinopril (ZESTRIL) 10 MG tablet Take 1 tablet (10 mg total) by mouth daily.   loperamide (IMODIUM) 2 MG capsule Take 6-8 mg by mouth daily as needed for diarrhea or loose stools.   metFORMIN (GLUCOPHAGE-XR) 500 MG 24 hr tablet Take 2 tablets (1,000 mg total) by mouth every  evening.   metoprolol tartrate (LOPRESSOR) 25 MG tablet Take 1 tablet (25 mg total) by mouth 2 (two) times daily.   OZEMPIC, 1 MG/DOSE, 4 MG/3ML SOPN INJECT 1 MG SUBQ AS DIRECTED ONCE WEEKLY   No facility-administered encounter medications on file as of 12/26/2021.  :  Review of Systems:  Out of a complete 14 point review of systems, all are reviewed and negative with the exception of these symptoms as listed below:  Virtual Visit via Video Note on 12/26/2021  I connected with Jimmy Tyler on 12/26/21 at 10:45 AM EDT by a video enabled telemedicine application and verified that I am speaking with the correct person using two identifiers.   I discussed the limitations of evaluation and management by telemedicine and the availability of in person appointments. The patient expressed understanding and agreed to proceed.  History of Present Illness: See above.   Observations/Objective: Patient is pleasant, conversant, in no acute distress, no difficulty with tracking, upper body movements normal, face is symmetric, speech is clear without dysarthria, hypophonia or voice tremor.  No labored breathing.  Assessment and Plan and Follow Up Instructions: In summary, Jimmy Tyler is a 46 year old male with an underlying medical history of allergies, asthma, diabetes, reflux disease, history of non-STEMI, hypertension, hyperlipidemia, testicular hypofunction, umbilical hernia and severe obesity with a BMI of over 72, s/p bariatric surgery, who presents for follow-up consultation of his obstructive sleep apnea.  He has been on CPAP therapy at a pressure of 16 cm with full compliance.  He relocated from [place] to [place] about a year and a half ago.  We will proceed with a new prescription for supplies.  Once he is due for a new machine, we will pursue home sleep testing to update his diagnosis.  We talked about alternative treatment options again today including inspire.  We mutually agreed to  continue with positive airway pressure treatment.  I explained the inspire indications and limitations today to him.  He can follow-up routinely in this clinic in 1 year,  he is encouraged to see one of our nurse practitioners. I answered all his questions today and he was in agreement with the plan.     I discussed the assessment and treatment plan with the patient. The patient was provided an opportunity to ask questions and all were answered. The patient agreed with the plan and demonstrated an understanding of the instructions.   The patient was advised to call back or seek an in-person evaluation if the symptoms worsen or if the condition fails to improve as anticipated.  I provided 20 minutes of non-face-to-face time during this encounter.   [name], MD

## 2022-01-01 DIAGNOSIS — G4733 Obstructive sleep apnea (adult) (pediatric): Secondary | ICD-10-CM | POA: Diagnosis not present

## 2022-01-01 DIAGNOSIS — I10 Essential (primary) hypertension: Secondary | ICD-10-CM | POA: Diagnosis not present

## 2022-01-11 ENCOUNTER — Other Ambulatory Visit: Payer: Medicaid Other

## 2022-01-11 DIAGNOSIS — E782 Mixed hyperlipidemia: Secondary | ICD-10-CM | POA: Diagnosis not present

## 2022-01-11 DIAGNOSIS — I10 Essential (primary) hypertension: Secondary | ICD-10-CM

## 2022-01-11 DIAGNOSIS — E1165 Type 2 diabetes mellitus with hyperglycemia: Secondary | ICD-10-CM

## 2022-01-11 LAB — BAYER DCA HB A1C WAIVED: HB A1C (BAYER DCA - WAIVED): 6.4 % — ABNORMAL HIGH (ref 4.8–5.6)

## 2022-01-12 ENCOUNTER — Encounter: Payer: Self-pay | Admitting: Family Medicine

## 2022-01-12 ENCOUNTER — Ambulatory Visit (INDEPENDENT_AMBULATORY_CARE_PROVIDER_SITE_OTHER): Payer: Medicaid Other | Admitting: Family Medicine

## 2022-01-12 VITALS — BP 123/78 | HR 79 | Temp 96.7°F | Ht 71.0 in | Wt 279.8 lb

## 2022-01-12 DIAGNOSIS — E1165 Type 2 diabetes mellitus with hyperglycemia: Secondary | ICD-10-CM | POA: Diagnosis not present

## 2022-01-12 DIAGNOSIS — I10 Essential (primary) hypertension: Secondary | ICD-10-CM

## 2022-01-12 DIAGNOSIS — I201 Angina pectoris with documented spasm: Secondary | ICD-10-CM

## 2022-01-12 DIAGNOSIS — E782 Mixed hyperlipidemia: Secondary | ICD-10-CM

## 2022-01-12 LAB — CMP14+EGFR
ALT: 37 IU/L (ref 0–44)
AST: 26 IU/L (ref 0–40)
Albumin/Globulin Ratio: 1.4 (ref 1.2–2.2)
Albumin: 4.5 g/dL (ref 4.1–5.1)
Alkaline Phosphatase: 64 IU/L (ref 44–121)
BUN/Creatinine Ratio: 15 (ref 9–20)
BUN: 13 mg/dL (ref 6–24)
Bilirubin Total: 0.4 mg/dL (ref 0.0–1.2)
CO2: 22 mmol/L (ref 20–29)
Calcium: 9.3 mg/dL (ref 8.7–10.2)
Chloride: 101 mmol/L (ref 96–106)
Creatinine, Ser: 0.88 mg/dL (ref 0.76–1.27)
Globulin, Total: 3.2 g/dL (ref 1.5–4.5)
Glucose: 131 mg/dL — ABNORMAL HIGH (ref 70–99)
Potassium: 4.2 mmol/L (ref 3.5–5.2)
Sodium: 137 mmol/L (ref 134–144)
Total Protein: 7.7 g/dL (ref 6.0–8.5)
eGFR: 106 mL/min/{1.73_m2} (ref >59)

## 2022-01-12 LAB — LIPID PANEL
Chol/HDL Ratio: 3.7 ratio (ref 0.0–5.0)
Cholesterol, Total: 146 mg/dL (ref 100–199)
HDL: 39 mg/dL — ABNORMAL LOW (ref >39)
LDL Chol Calc (NIH): 56 mg/dL (ref 0–99)
Triglycerides: 334 mg/dL — ABNORMAL HIGH (ref 0–149)
VLDL Cholesterol Cal: 51 mg/dL — ABNORMAL HIGH (ref 5–40)

## 2022-01-12 LAB — CBC WITH DIFFERENTIAL/PLATELET
Basophils Absolute: 0 10*3/uL (ref 0.0–0.2)
Basos: 1 %
EOS (ABSOLUTE): 0.1 10*3/uL (ref 0.0–0.4)
Eos: 2 %
Hematocrit: 43.2 % (ref 37.5–51.0)
Hemoglobin: 14.7 g/dL (ref 13.0–17.7)
Immature Grans (Abs): 0 10*3/uL (ref 0.0–0.1)
Immature Granulocytes: 0 %
Lymphocytes Absolute: 1.8 10*3/uL (ref 0.7–3.1)
Lymphs: 32 %
MCH: 30.5 pg (ref 26.6–33.0)
MCHC: 34 g/dL (ref 31.5–35.7)
MCV: 90 fL (ref 79–97)
Monocytes Absolute: 0.4 10*3/uL (ref 0.1–0.9)
Monocytes: 7 %
Neutrophils Absolute: 3.4 10*3/uL (ref 1.4–7.0)
Neutrophils: 58 %
Platelets: 268 10*3/uL (ref 150–450)
RBC: 4.82 x10E6/uL (ref 4.14–5.80)
RDW: 12.7 % (ref 11.6–15.4)
WBC: 5.8 10*3/uL (ref 3.4–10.8)

## 2022-01-12 LAB — VITAMIN B12: Vitamin B-12: 789 pg/mL (ref 232–1245)

## 2022-01-12 MED ORDER — LISINOPRIL 10 MG PO TABS: 10.0000 mg | ORAL_TABLET | Freq: Every day | ORAL | 1 refills | Status: DC

## 2022-01-12 MED ORDER — METOPROLOL TARTRATE 25 MG PO TABS: 25.0000 mg | ORAL_TABLET | Freq: Two times a day (BID) | ORAL | 1 refills | Status: DC

## 2022-01-12 MED ORDER — DAPAGLIFLOZIN PROPANEDIOL 10 MG PO TABS: 10.0000 mg | ORAL_TABLET | Freq: Every day | ORAL | 1 refills | Status: DC

## 2022-01-12 MED ORDER — METFORMIN HCL ER 500 MG PO TB24: 1000.0000 mg | ORAL_TABLET | Freq: Every evening | ORAL | 1 refills | Status: DC

## 2022-01-12 MED ORDER — OZEMPIC (1 MG/DOSE) 4 MG/3ML ~~LOC~~ SOPN: 1.0000 mg | PEN_INJECTOR | SUBCUTANEOUS | 1 refills | Status: DC

## 2022-01-12 MED ORDER — NEXLIZET 180-10 MG PO TABS: 1.0000 | ORAL_TABLET | Freq: Every day | ORAL | 1 refills | Status: DC

## 2022-01-12 NOTE — Progress Notes (Signed)
Assessment & Plan:  1. Type 2 diabetes mellitus with hyperglycemia, without long-term current use of insulin (HCC) Lab Results  Component Value Date   HGBA1C 6.4 (H) 01/11/2022   HGBA1C 7.5 (H) 10/11/2021   HGBA1C 7.9 (H) 07/10/2021    - Diabetes is at goal of A1c < 7. - Medications: continue current medications - Patient is not currently taking a statin. Patient is taking an ACE-inhibitor/ARB.  - Instruction/counseling given: discussed the need for weight loss and discussed diet  Diabetes Health Maintenance Due  Topic Date Due   OPHTHALMOLOGY EXAM  Never done   FOOT EXAM  02/02/2022   HEMOGLOBIN A1C  07/14/2022    Lab Results  Component Value Date   LABMICR <3.0 07/13/2021   LABMICR 12.9 02/08/2020   2. Mixed hyperlipidemia Well controlled on current regimen.   3. Essential hypertension Well controlled on current regimen.   4. Coronary vasospasm (HCC) Well controlled on current regimen.   5. Morbid obesity ([place]) Congratulated on 11 lb weight loss over the past three months. Encouraged healthy eating and exercise. Patient declined a dose increase in Ozempic to further aid in weight loss.   Return in about 6 months (around 07/15/2022) for follow-up of chronic medication conditions.  Hendricks Limes, MSN, APRN, FNP-C [facility]  Subjective:    Patient ID: Jimmy Tyler, male    DOB: 1975-08-16, 46 y.o.   MRN: [id number]  Patient Care Team: Loman Brooklyn, FNP as PCP - General (Family Medicine) Satira Sark, MD as PCP - Cardiology (Cardiology) Eloise Harman, DO as Consulting Physician (Internal Medicine) Harlen Labs, MD as Referring Physician (Optometry)   Chief Complaint:  Chief Complaint  Patient presents with   Medical Management of Chronic Issues   Diabetes    3 month follow up     HPI: Jimmy Tyler is a 46 y.o. male presenting on 01/12/2022 for Medical Management of Chronic Issues and Diabetes (3 month follow  up )  Hypertension: He is taking amlodipine 10 mg QD, lisinopril 10 mg, and metoprolol 25 mg BID. He is not checking his blood pressures at home because he does not have a cuff.   Diabetes:  Patient presents for follow up of diabetes. Current symptoms include: hyperglycemia. Known diabetic complications: cardiovascular disease. Medication compliance: He is taking metformin 1,000 mg QD, Farxiga 10 mg QD, and Ozempic 1 mg weekly. Current diet: in general, an "unhealthy" diet. Current exercise: no regular exercise. Home blood sugar records: patient does not check sugars. Is he  on ACE inhibitor or angiotensin II receptor blocker? Yes (Lisinopril). Is he on a statin? No, intolerant.  Hyperlipidemia: He is taking not taking omega-3 fatty acid. He is taking Nexlizet in lieu of statin due to intolerance.   New complaints: None   Social history:  Relevant past medical, surgical, family and social history reviewed and updated as indicated. Interim medical history since our last visit reviewed.  Allergies and medications reviewed and updated.  DATA REVIEWED: CHART IN EPIC  ROS: Negative unless specifically indicated above in HPI.    Current Outpatient Medications:    amLODipine (NORVASC) 10 MG tablet, Take 1 tablet (10 mg total) by mouth daily., Disp: 90 tablet, Rfl: 1   Bempedoic Acid-Ezetimibe (NEXLIZET) 180-10 MG TABS, Take 1 tablet by mouth daily., Disp: 90 tablet, Rfl: 1   dapagliflozin propanediol (FARXIGA) 10 MG TABS tablet, Take 1 tablet (10 mg total) by mouth daily., Disp: 90 tablet, Rfl:  1   Famotidine (PEPCID PO), Take 20 mg by mouth at bedtime., Disp: , Rfl:    fluticasone (FLONASE) 50 MCG/ACT nasal spray, Place 2 sprays into both nostrils daily., Disp: 16 g, Rfl: 6   levocetirizine (XYZAL) 5 MG tablet, Take 1 tablet (5 mg total) by mouth every evening., Disp: 90 tablet, Rfl: 1   lisinopril (ZESTRIL) 10 MG tablet, Take 1 tablet (10 mg total) by mouth daily., Disp: 90 tablet, Rfl:  1   loperamide (IMODIUM) 2 MG capsule, Take 6-8 mg by mouth daily as needed for diarrhea or loose stools., Disp: , Rfl:    metFORMIN (GLUCOPHAGE-XR) 500 MG 24 hr tablet, Take 2 tablets (1,000 mg total) by mouth every evening., Disp: 180 tablet, Rfl: 1   metoprolol tartrate (LOPRESSOR) 25 MG tablet, Take 1 tablet (25 mg total) by mouth 2 (two) times daily., Disp: 180 tablet, Rfl: 1   OZEMPIC, 1 MG/DOSE, 4 MG/3ML SOPN, INJECT 1 MG SUBQ AS DIRECTED ONCE WEEKLY, Disp: 3 mL, Rfl: 0   Allergies  Allergen Reactions   Gramineae Pollens    Statins Other (See Comments)    Body aches.  Failed 3 different ones. Body aches.  Failed 3 different ones. Body aches.  Failed 3 different ones. Other reaction(s): Other (See Comments) Body aches.  Failed 3 different ones. Body aches.  Failed 3 different ones.   Past Medical History:  Diagnosis Date   Allergy    Asthma    Diabetes mellitus without complication (HCC)    GERD (gastroesophageal reflux disease)    History of Clostridium difficile infection    History of non-ST elevation myocardial infarction (NSTEMI)    Hyperlipidemia    Hypertension    Myocardial infarction ([place]) 2014   OSA on CPAP    Testicular hypofunction    Umbilical hernia     Past Surgical History:  Procedure Laterality Date   BARIATRIC SURGERY  07/13/2013   BIOPSY  07/19/2020   Procedure: BIOPSY;  Surgeon: Eloise Harman, DO;  Location: [facility];  Service: Endoscopy;;   COLONOSCOPY WITH PROPOFOL N/A 07/19/2020   Procedure: COLONOSCOPY WITH PROPOFOL;  Surgeon: Eloise Harman, DO;  Location: [facility];  Service: Endoscopy;  Laterality: N/A;  early am appt   ESOPHAGOGASTRODUODENOSCOPY (EGD) WITH PROPOFOL N/A 07/19/2020   Procedure: ESOPHAGOGASTRODUODENOSCOPY (EGD) WITH PROPOFOL;  Surgeon: Eloise Harman, DO;  Location: [facility];  Service: Endoscopy;  Laterality: N/A;   HIATAL HERNIA REPAIR     KNEE SURGERY Left 2015   Due to meniscal tear   LEFT HEART CATH AND  CORONARY ANGIOGRAPHY N/A 07/28/2020   Procedure: LEFT HEART CATH AND CORONARY ANGIOGRAPHY;  Surgeon: Leonie Man, MD;  Location: [facility];  Service: Cardiovascular;  Laterality: N/A;   NASAL SEPTUM SURGERY  03/2019   POLYPECTOMY  07/19/2020   Procedure: POLYPECTOMY;  Surgeon: Eloise Harman, DO;  Location: [facility];  Service: Endoscopy;;    Social History   Socioeconomic History   Marital status: Divorced    Spouse name: Not on file   Number of children: Not on file   Years of education: Not on file   Highest education level: Not on file  Occupational History   Not on file  Tobacco Use   Smoking status: Former    Packs/day: 1.00    Types: Cigarettes    Quit date: 02/04/2014    Years since quitting: 7.9   Smokeless tobacco: Never  Vaping Use  Vaping Use: Never used  Substance and Sexual Activity   Alcohol use: Yes    Comment: very rarely    Drug use: Never   Sexual activity: Yes    Birth control/protection: None  Other Topics Concern   Not on file  Social History Narrative   Not on file   Social Determinants of Health   Financial Resource Strain: Not on file  Food Insecurity: Not on file  Transportation Needs: Not on file  Physical Activity: Not on file  Stress: Not on file  Social Connections: Not on file  Intimate Partner Violence: Not on file        Objective:    BP 123/78   Pulse 79   Temp (!) 96.7 F (35.9 C) (Temporal)   Ht _0  (1.803 m)   Wt 279 lb 12.8 oz (126.9 kg)   SpO2 94%   BMI 39.02 kg/m   Wt Readings from Last 3 Encounters:  01/12/22 279 lb 12.8 oz (126.9 kg)  11/03/21 287 lb 3.2 oz (130.3 kg)  10/11/21 290 lb (131.5 kg)    Physical Exam Vitals reviewed.  Constitutional:      General: He is not in acute distress.    Appearance: Normal appearance. He is morbidly obese. He is not ill-appearing, toxic-appearing or diaphoretic.  HENT:     Head: Normocephalic and atraumatic.  Eyes:     General: No scleral  icterus.       Right eye: No discharge.        Left eye: No discharge.     Conjunctiva/sclera: Conjunctivae normal.  Cardiovascular:     Rate and Rhythm: Normal rate and regular rhythm.     Heart sounds: Normal heart sounds. No murmur heard.    No friction rub. No gallop.  Pulmonary:     Effort: Pulmonary effort is normal. No respiratory distress.     Breath sounds: Normal breath sounds. No stridor. No wheezing, rhonchi or rales.  Abdominal:     Palpations: Abdomen is soft.  Musculoskeletal:        General: Normal range of motion.     Cervical back: Normal range of motion.     Right lower leg: No edema.     Left lower leg: No edema.  Skin:    General: Skin is warm and dry.     Capillary Refill: Capillary refill takes less than 2 seconds.  Neurological:     Mental Status: He is alert and oriented to person, place, and time. Mental status is at baseline.  Psychiatric:        Mood and Affect: Mood normal.        Behavior: Behavior normal.        Thought Content: Thought content normal.        Judgment: Judgment normal.     Lab Results  Component Value Date   TSH 1.850 02/08/2020   Lab Results  Component Value Date   WBC 5.8 01/11/2022   HGB 14.7 01/11/2022   HCT 43.2 01/11/2022   MCV 90 01/11/2022   PLT 268 01/11/2022   Lab Results  Component Value Date   NA 137 01/11/2022   K 4.2 01/11/2022   CO2 22 01/11/2022   GLUCOSE 131 (H) 01/11/2022   BUN 13 01/11/2022   CREATININE 0.88 01/11/2022   BILITOT 0.4 01/11/2022   ALKPHOS 64 01/11/2022   AST 26 01/11/2022   ALT 37 01/11/2022   PROT 7.7 01/11/2022   ALBUMIN 4.5 01/11/2022  CALCIUM 9.3 01/11/2022   ANIONGAP 6 07/29/2020   EGFR 106 01/11/2022   Lab Results  Component Value Date   CHOL 146 01/11/2022   Lab Results  Component Value Date   HDL 39 (L) 01/11/2022   Lab Results  Component Value Date   LDLCALC 56 01/11/2022   Lab Results  Component Value Date   TRIG 334 (H) 01/11/2022   Lab Results   Component Value Date   CHOLHDL 3.7 01/11/2022   Lab Results  Component Value Date   HGBA1C 6.4 (H) 01/11/2022

## 2022-01-16 DIAGNOSIS — I10 Essential (primary) hypertension: Secondary | ICD-10-CM | POA: Diagnosis not present

## 2022-01-16 DIAGNOSIS — G4733 Obstructive sleep apnea (adult) (pediatric): Secondary | ICD-10-CM | POA: Diagnosis not present

## 2022-01-19 DIAGNOSIS — Z419 Encounter for procedure for purposes other than remedying health state, unspecified: Secondary | ICD-10-CM | POA: Diagnosis not present

## 2022-01-20 IMAGING — DX DG CHEST 2V
2 series · 2 of 2 positions shown · non-contrast
Comparison: None.

CLINICAL DATA: 44-year-old male with chest pain onset this morning.
Former smoker.

EXAM:
CHEST - 2 VIEW

[chest pa]
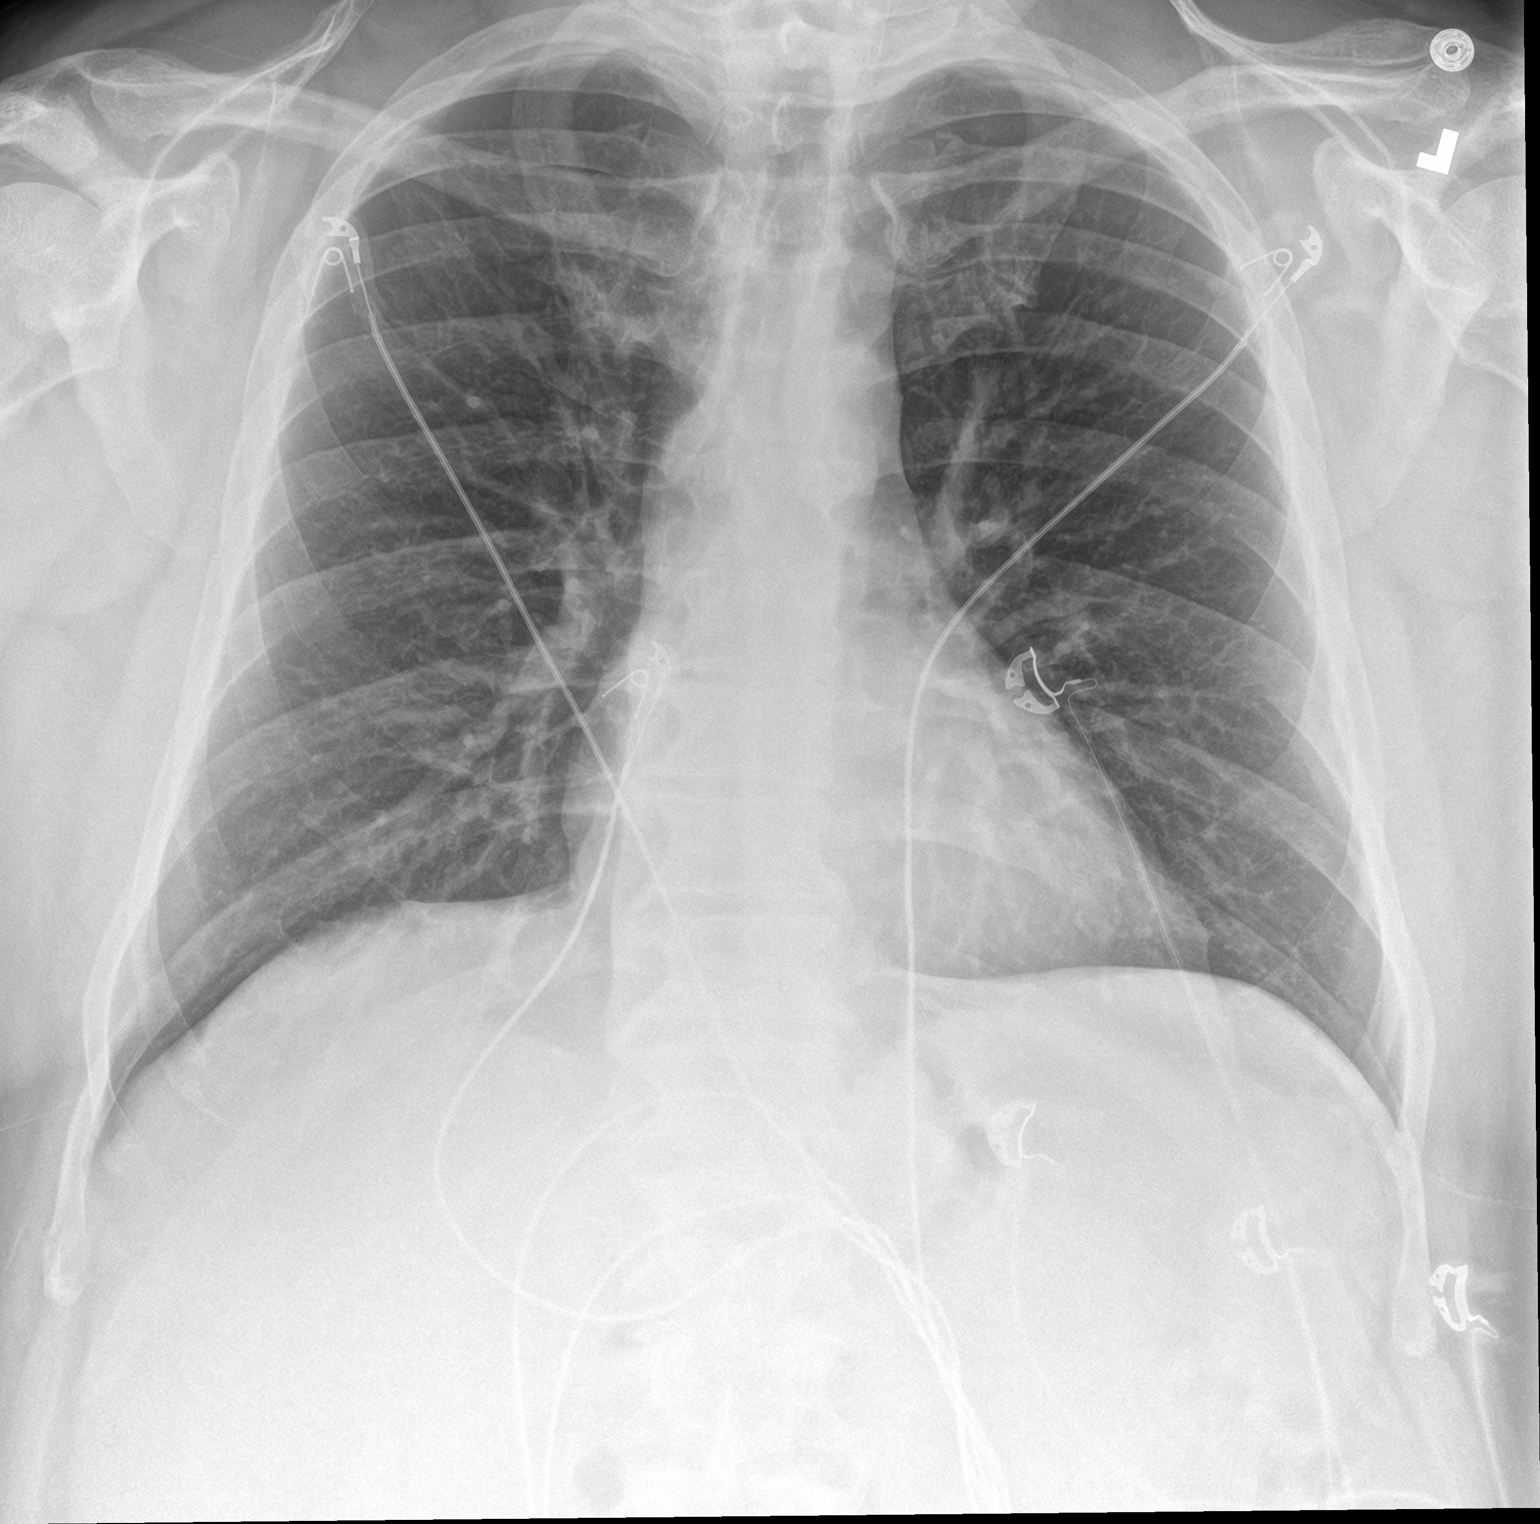

[chest lat]
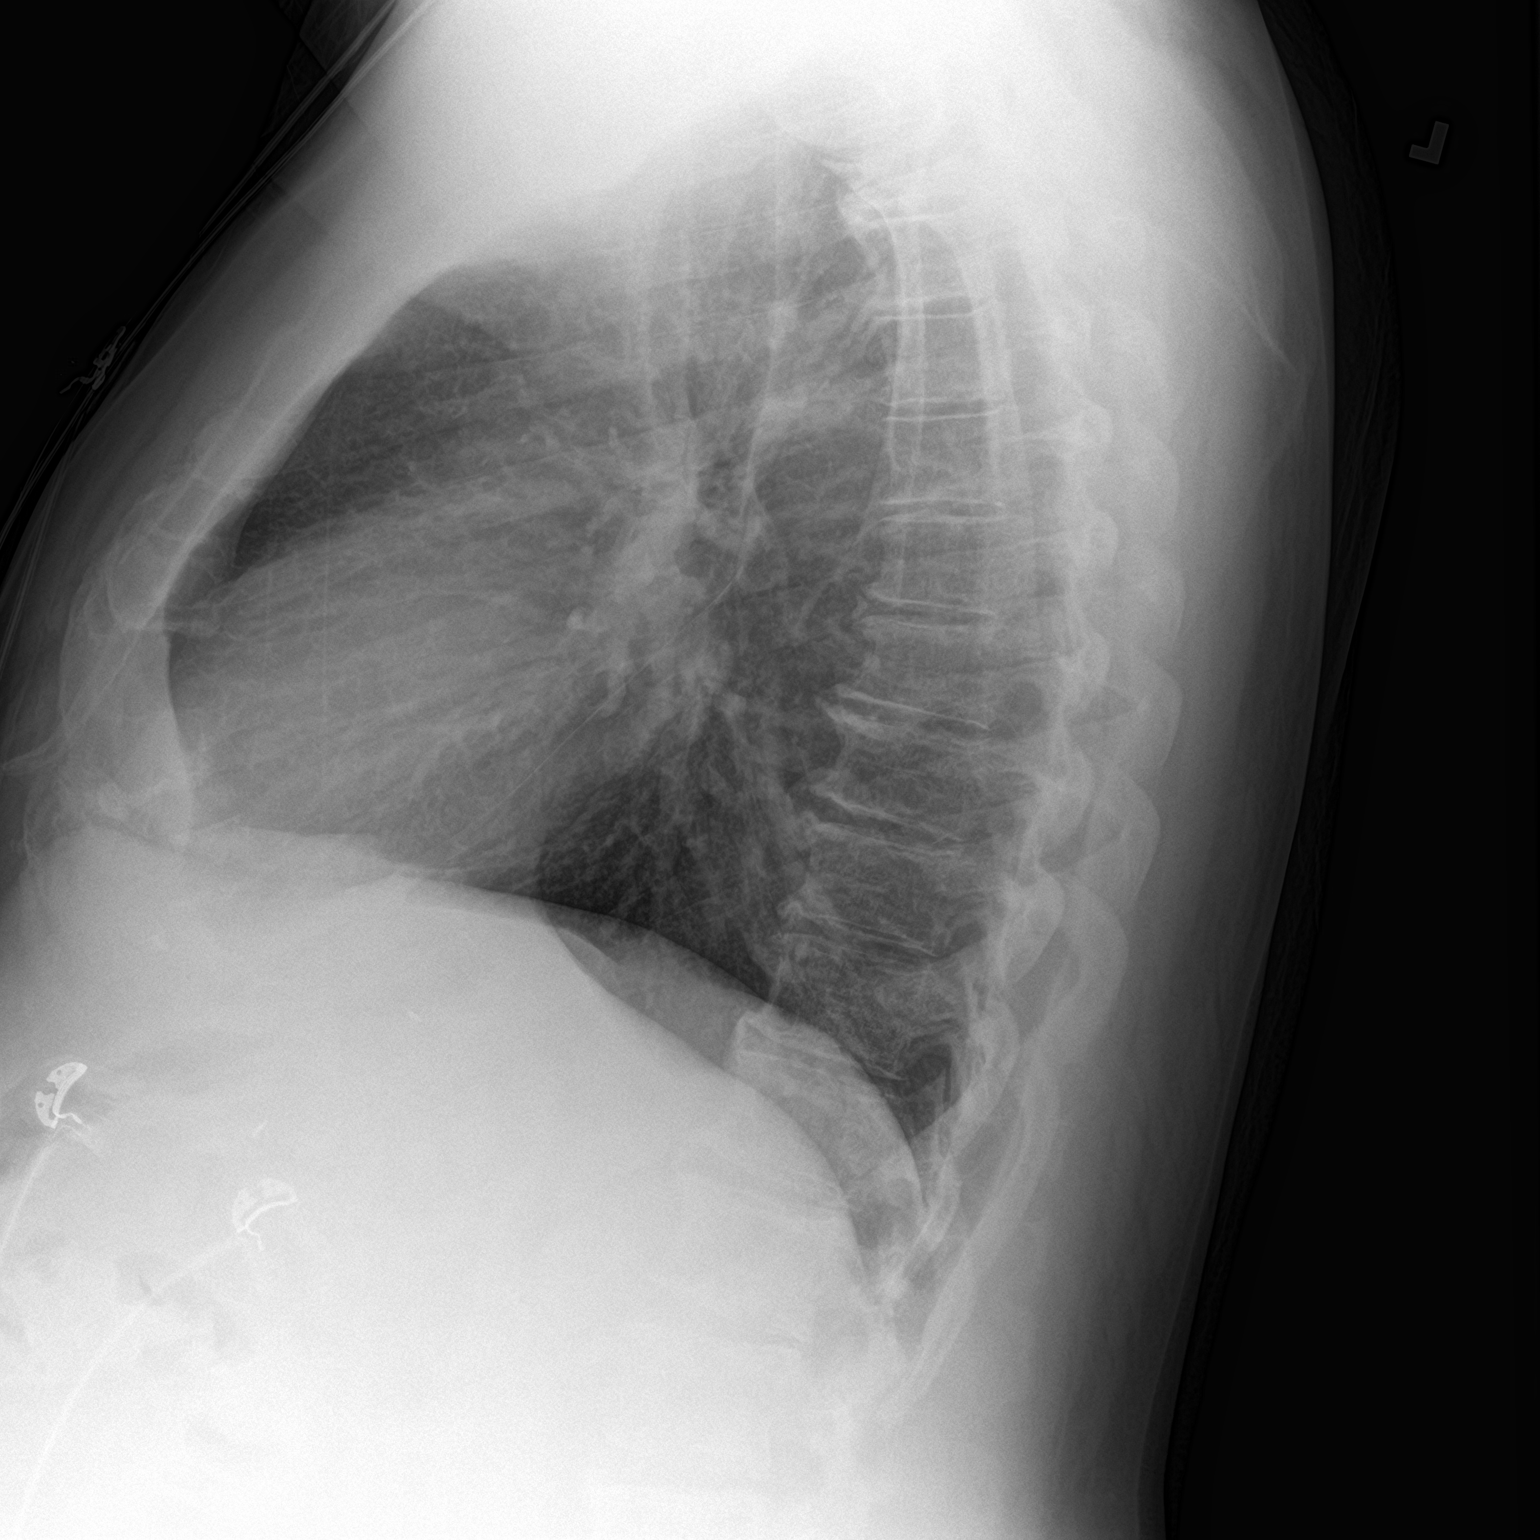

[2 of 2 positions shown; findings below may reference images not displayed]

FINDINGS: Normal lung volumes and mediastinal contours. Visualized tracheal
air column is within normal limits. Lung markings are within normal
limits. No pneumothorax or pleural effusion. Degenerative endplate
spurring in the thoracic spine. No acute osseous abnormality
identified. Negative visible bowel gas.
IMPRESSION: Negative.  No cardiopulmonary abnormality.

## 2022-01-29 ENCOUNTER — Telehealth: Payer: Self-pay | Admitting: Adult Health

## 2022-01-29 NOTE — Telephone Encounter (Signed)
LVM and sent mychart msg informing pt of need to reschedule 9/28 appointment - NP out

## 2022-02-15 ENCOUNTER — Ambulatory Visit: Payer: Medicaid Other | Admitting: Adult Health

## 2022-02-18 DIAGNOSIS — Z419 Encounter for procedure for purposes other than remedying health state, unspecified: Secondary | ICD-10-CM | POA: Diagnosis not present

## 2022-02-27 DIAGNOSIS — I10 Essential (primary) hypertension: Secondary | ICD-10-CM | POA: Diagnosis not present

## 2022-02-27 DIAGNOSIS — G4733 Obstructive sleep apnea (adult) (pediatric): Secondary | ICD-10-CM | POA: Diagnosis not present

## 2022-03-21 DIAGNOSIS — Z419 Encounter for procedure for purposes other than remedying health state, unspecified: Secondary | ICD-10-CM | POA: Diagnosis not present

## 2022-03-28 ENCOUNTER — Other Ambulatory Visit: Payer: Self-pay | Admitting: Family Medicine

## 2022-03-28 DIAGNOSIS — I201 Angina pectoris with documented spasm: Secondary | ICD-10-CM

## 2022-03-28 DIAGNOSIS — I10 Essential (primary) hypertension: Secondary | ICD-10-CM

## 2022-04-05 ENCOUNTER — Other Ambulatory Visit: Payer: Self-pay | Admitting: Family Medicine

## 2022-04-05 DIAGNOSIS — J302 Other seasonal allergic rhinitis: Secondary | ICD-10-CM

## 2022-04-17 ENCOUNTER — Ambulatory Visit (INDEPENDENT_AMBULATORY_CARE_PROVIDER_SITE_OTHER): Payer: Medicaid Other | Admitting: Family Medicine

## 2022-04-17 ENCOUNTER — Encounter: Payer: Self-pay | Admitting: Family Medicine

## 2022-04-17 VITALS — BP 121/80 | HR 73 | Temp 98.2°F | Ht 71.0 in | Wt 277.2 lb

## 2022-04-17 DIAGNOSIS — E782 Mixed hyperlipidemia: Secondary | ICD-10-CM | POA: Diagnosis not present

## 2022-04-17 DIAGNOSIS — E1165 Type 2 diabetes mellitus with hyperglycemia: Secondary | ICD-10-CM

## 2022-04-17 DIAGNOSIS — I152 Hypertension secondary to endocrine disorders: Secondary | ICD-10-CM

## 2022-04-17 DIAGNOSIS — E1169 Type 2 diabetes mellitus with other specified complication: Secondary | ICD-10-CM

## 2022-04-17 DIAGNOSIS — E785 Hyperlipidemia, unspecified: Secondary | ICD-10-CM

## 2022-04-17 DIAGNOSIS — E1159 Type 2 diabetes mellitus with other circulatory complications: Secondary | ICD-10-CM | POA: Diagnosis not present

## 2022-04-17 DIAGNOSIS — I10 Essential (primary) hypertension: Secondary | ICD-10-CM | POA: Diagnosis not present

## 2022-04-17 LAB — BAYER DCA HB A1C WAIVED: HB A1C (BAYER DCA - WAIVED): 6.5 % — ABNORMAL HIGH (ref 4.8–5.6)

## 2022-04-17 NOTE — Progress Notes (Signed)
Subjective:  Patient ID: Jimmy Tyler, male    DOB: July 21, 1975, 46 y.o.   MRN: [id number]  Patient Care Team: Baruch Gouty, FNP as PCP - General (Family Medicine) Satira Sark, MD as PCP - Cardiology (Cardiology) Eloise Harman, DO as Consulting Physician (Internal Medicine) Harlen Labs, MD as Referring Physician (Optometry)   Chief Complaint:  Establish Care [facility] patient ) and Medical Management of Chronic Issues (6 month follow up )   HPI: Jimmy Tyler is a 46 y.o. male presenting on 04/17/2022 for [place] [facility] patient ) and Medical Management of Chronic Issues (6 month follow up )   1. Type 2 diabetes mellitus with hyperglycemia, without long-term current use of insulin (HCC) Currently on Farxiga, Metformin, and Ozempic. States he does not check blood sugars at home. Reports ongoing diarrhea for several years, has seen GI without explanation of this. Recently started probiotic. Sees Podiatry on a yearly basis. Has seen Dr. Marin Comment for eye exam, records requested. Denies hyper- or hypoglycemic symptoms.   2. Hyperlipidemia associated with type 2 diabetes mellitus ([place]) On Nexlizet and tolerating well. No myalgias. Does not exercise on a regular basis but has been watching his diet.   3. Hypertension associated with type 2 diabetes mellitus ([place]) Currenlty on norvasc, lisinopril, and lopressor with great control of BP. Denies chest pain, headaches, visual changes, leg swelling, weakness, confusion, or syncope.   4. Morbid obesity ([place]) Does try to follow a healthier diet but does not exercise regularly. Has last 2 lbs since last visit.      Relevant past medical, surgical, family, and social history reviewed and updated as indicated.  Allergies and medications reviewed and updated. Data reviewed: Chart in Epic.   Past Medical History:  Diagnosis Date   Allergy    Asthma    Diabetes mellitus without complication ([place])    GERD (gastroesophageal  reflux disease)    History of Clostridium difficile infection    Hyperlipidemia    Hypertension    Myocardial infarction ([place]) 2014   OSA on CPAP    Testicular hypofunction    Umbilical hernia     Past Surgical History:  Procedure Laterality Date   BARIATRIC SURGERY  07/13/2013   BIOPSY  07/19/2020   Procedure: BIOPSY;  Surgeon: Eloise Harman, DO;  [facility];  Service: Endoscopy;;   COLONOSCOPY WITH PROPOFOL N/A 07/19/2020   Procedure: COLONOSCOPY WITH PROPOFOL;  Surgeon: Eloise Harman, DO;  [facility];  Service: Endoscopy;  Laterality: N/A;  early am appt   ESOPHAGOGASTRODUODENOSCOPY (EGD) WITH PROPOFOL N/A 07/19/2020   Procedure: ESOPHAGOGASTRODUODENOSCOPY (EGD) WITH PROPOFOL;  Surgeon: Eloise Harman, DO;  [facility];  Service: Endoscopy;  Laterality: N/A;   HIATAL HERNIA REPAIR     KNEE SURGERY Left 2015   Due to meniscal tear   LEFT HEART CATH AND CORONARY ANGIOGRAPHY N/A 07/28/2020   Procedure: LEFT HEART CATH AND CORONARY ANGIOGRAPHY;  Surgeon: Leonie Man, MD;  Location: [facility];  Service: Cardiovascular;  Laterality: N/A;   NASAL SEPTUM SURGERY  03/2019   POLYPECTOMY  07/19/2020   Procedure: POLYPECTOMY;  Surgeon: Eloise Harman, DO;  [facility]: [facility];  Service: Endoscopy;;    Social History   Socioeconomic History   Marital status: Divorced    Spouse name: Not on file   Number of children: Not on file   Years of education: Not on  file   Highest education level: Not on file  Occupational History   Not on file  Tobacco Use   Smoking status: Former    Packs/day: 1.00    Types: Cigarettes    Quit date: 02/04/2014    Years since quitting: 8.2   Smokeless tobacco: Never  Vaping Use   Vaping Use: Never used  Substance and Sexual Activity   Alcohol use: Yes    Comment: very rarely    Drug use: Never   Sexual activity: Yes    Birth control/protection: None  Other Topics Concern   Not on file   Social History Narrative   Not on file   Social Determinants of Health   Financial Resource Strain: Not on file  Food Insecurity: Not on file  Transportation Needs: Not on file  Physical Activity: Not on file  Stress: Not on file  Social Connections: Not on file  Intimate Partner Violence: Not on file    Outpatient Encounter Medications as of 04/17/2022  Medication Sig   amLODipine (NORVASC) 10 MG tablet Take 1 tablet by mouth once daily   Bempedoic Acid-Ezetimibe (NEXLIZET) 180-10 MG TABS Take 1 tablet by mouth daily.   dapagliflozin propanediol (FARXIGA) 10 MG TABS tablet Take 1 tablet (10 mg total) by mouth daily.   Famotidine (PEPCID PO) Take 20 mg by mouth at bedtime.   fluticasone (FLONASE) 50 MCG/ACT nasal spray Place 2 sprays into both nostrils daily.   levocetirizine (XYZAL) 5 MG tablet TAKE 1 TABLET BY MOUTH ONCE DAILY IN THE EVENING   lisinopril (ZESTRIL) 10 MG tablet Take 1 tablet (10 mg total) by mouth daily.   loperamide (IMODIUM) 2 MG capsule Take 6-8 mg by mouth daily as needed for diarrhea or loose stools.   metoprolol tartrate (LOPRESSOR) 25 MG tablet Take 1 tablet (25 mg total) by mouth 2 (two) times daily.   Semaglutide, 1 MG/DOSE, (OZEMPIC, 1 MG/DOSE,) 4 MG/3ML SOPN Inject 1 mg into the skin once a week.   [DISCONTINUED] metFORMIN (GLUCOPHAGE-XR) 500 MG 24 hr tablet Take 2 tablets (1,000 mg total) by mouth every evening.   No facility-administered encounter medications on file as of 04/17/2022.    Allergies  Allergen Reactions   Gramineae Pollens    Statins Other (See Comments)    Body aches.  Failed 3 different ones. Body aches.  Failed 3 different ones. Body aches.  Failed 3 different ones. Other reaction(s): Other (See Comments) Body aches.  Failed 3 different ones. Body aches.  Failed 3 different ones.    Review of Systems  Constitutional:  Negative for activity change, appetite change, chills, diaphoresis, fatigue, fever and unexpected weight  change.  HENT: Negative.    Eyes: Negative.  Negative for photophobia and visual disturbance.  Respiratory:  Negative for cough, chest tightness and shortness of breath.   Cardiovascular:  Negative for chest pain, palpitations and leg swelling.  Gastrointestinal:  Positive for diarrhea. Negative for abdominal distention, abdominal pain, anal bleeding, blood in stool, constipation, nausea, rectal pain and vomiting.  Endocrine: Negative.  Negative for polydipsia, polyphagia and polyuria.  Genitourinary:  Negative for decreased urine volume, difficulty urinating, dysuria, frequency and urgency.  Musculoskeletal:  Negative for arthralgias and myalgias.  Skin: Negative.   Allergic/Immunologic: Negative.   Neurological:  Negative for dizziness, tremors, seizures, syncope, facial asymmetry, speech difficulty, weakness, light-headedness, numbness and headaches.  Hematological: Negative.   Psychiatric/Behavioral:  Negative for confusion, hallucinations, sleep disturbance and suicidal ideas.   All other systems reviewed  and are negative.       Objective:  BP 121/80   Pulse 73   Temp 98.2 F (36.8 C) (Temporal)   Ht _0  (1.803 m)   Wt 277 lb 3.2 oz (125.7 kg)   SpO2 98%   BMI 38.66 kg/m    Wt Readings from Last 3 Encounters:  04/17/22 277 lb 3.2 oz (125.7 kg)  01/12/22 279 lb 12.8 oz (126.9 kg)  11/03/21 287 lb 3.2 oz (130.3 kg)    Physical Exam Vitals and nursing note reviewed.  Constitutional:      General: He is not in acute distress.    Appearance: Normal appearance. He is well-developed and well-groomed. He is morbidly obese. He is not ill-appearing, toxic-appearing or diaphoretic.  HENT:     Head: Normocephalic and atraumatic.     Jaw: There is normal jaw occlusion.     Right Ear: Hearing normal.     Left Ear: Hearing normal.     Nose: Nose normal.     Mouth/Throat:     Lips: Pink.     Mouth: Mucous membranes are moist.     Pharynx: Uvula midline.  Eyes:     General:  Lids are normal.     Conjunctiva/sclera: Conjunctivae normal.     Pupils: Pupils are equal, round, and reactive to light.  Neck:     Thyroid: No thyroid mass, thyromegaly or thyroid tenderness.     Vascular: No carotid bruit or JVD.     Trachea: Trachea and phonation normal.  Cardiovascular:     Rate and Rhythm: Normal rate and regular rhythm.     Chest Wall: PMI is not displaced.     Pulses: Normal pulses.          Dorsalis pedis pulses are 2+ on the right side and 2+ on the left side.       Posterior tibial pulses are 2+ on the right side and 2+ on the left side.     Heart sounds: Normal heart sounds. No murmur heard.    No friction rub. No gallop.  Pulmonary:     Effort: Pulmonary effort is normal. No respiratory distress.     Breath sounds: Normal breath sounds. No wheezing.  Abdominal:     General: There is no abdominal bruit.     Palpations: There is no hepatomegaly or splenomegaly.  Musculoskeletal:        General: Normal range of motion.     Cervical back: Normal range of motion and neck supple.     Right lower leg: No edema.     Left lower leg: No edema.  Feet:     Right foot:     Protective Sensation: 10 sites tested.  10 sites sensed.     Skin integrity: Callus and dry skin present.     Toenail Condition: Right toenails are abnormally thick. Fungal disease present.    Left foot:     Protective Sensation: 10 sites tested.  10 sites sensed.     Skin integrity: Callus and dry skin present.     Toenail Condition: Left toenails are abnormally thick. Fungal disease present. Lymphadenopathy:     Cervical: No cervical adenopathy.  Skin:    General: Skin is warm and dry.     Capillary Refill: Capillary refill takes less than 2 seconds.     Coloration: Skin is not cyanotic, jaundiced or pale.     Findings: No rash.  Neurological:     General:  No focal deficit present.     Mental Status: He is alert and oriented to person, place, and time.     Sensory: Sensation is  intact.     Motor: Motor function is intact.     Coordination: Coordination is intact.     Gait: Gait is intact.     Deep Tendon Reflexes: Reflexes are normal and symmetric.  Psychiatric:        Attention and Perception: Attention and perception normal.        Mood and Affect: Mood and affect normal.        Speech: Speech normal.        Behavior: Behavior normal. Behavior is cooperative.        Thought Content: Thought content normal.        Cognition and Memory: Cognition and memory normal.        Judgment: Judgment normal.     Results for orders placed or performed in visit on 01/11/22  Lipid panel  Result Value Ref Range   Cholesterol, Total 146 100 - 199 mg/dL   Triglycerides 334 (H) 0 - 149 mg/dL   HDL 39 (L) >39 mg/dL   VLDL Cholesterol Cal 51 (H) 5 - 40 mg/dL   LDL Chol Calc (NIH) 56 0 - 99 mg/dL   Chol/HDL Ratio 3.7 0.0 - 5.0 ratio  CBC with Differential/Platelet  Result Value Ref Range   WBC 5.8 3.4 - 10.8 x10E3/uL   RBC 4.82 4.14 - 5.80 x10E6/uL   Hemoglobin 14.7 13.0 - 17.7 g/dL   Hematocrit 43.2 37.5 - 51.0 %   MCV 90 79 - 97 fL   MCH 30.5 26.6 - 33.0 pg   MCHC 34.0 31.5 - 35.7 g/dL   RDW 12.7 11.6 - 15.4 %   Platelets 268 150 - 450 x10E3/uL   Neutrophils 58 Not Estab. %   Lymphs 32 Not Estab. %   Monocytes 7 Not Estab. %   Eos 2 Not Estab. %   Basos 1 Not Estab. %   Neutrophils Absolute 3.4 1.4 - 7.0 x10E3/uL   Lymphocytes Absolute 1.8 0.7 - 3.1 x10E3/uL   Monocytes Absolute 0.4 0.1 - 0.9 x10E3/uL   EOS (ABSOLUTE) 0.1 0.0 - 0.4 x10E3/uL   Basophils Absolute 0.0 0.0 - 0.2 x10E3/uL   Immature Granulocytes 0 Not Estab. %   Immature Grans (Abs) 0.0 0.0 - 0.1 x10E3/uL  CMP14+EGFR  Result Value Ref Range   Glucose 131 (H) 70 - 99 mg/dL   BUN 13 6 - 24 mg/dL   Creatinine, Ser 0.88 0.76 - 1.27 mg/dL   eGFR 106 >59 mL/min/1.73   BUN/Creatinine Ratio 15 9 - 20   Sodium 137 134 - 144 mmol/L   Potassium 4.2 3.5 - 5.2 mmol/L   Chloride 101 96 - 106 mmol/L    CO2 22 20 - 29 mmol/L   Calcium 9.3 8.7 - 10.2 mg/dL   Total Protein 7.7 6.0 - 8.5 g/dL   Albumin 4.5 4.1 - 5.1 g/dL   Globulin, Total 3.2 1.5 - 4.5 g/dL   Albumin/Globulin Ratio 1.4 1.2 - 2.2   Bilirubin Total 0.4 0.0 - 1.2 mg/dL   Alkaline Phosphatase 64 44 - 121 IU/L   AST 26 0 - 40 IU/L   ALT 37 0 - 44 IU/L  Bayer DCA Hb A1c Waived  Result Value Ref Range   HB A1C (BAYER DCA - WAIVED) 6.4 (H) 4.8 - 5.6 %  Vitamin B12  Result Value Ref  Range   Vitamin B-12 789 232 - 1,245 pg/mL       Pertinent labs & imaging results that were available during my care of the patient were reviewed by me and considered in my medical decision making.  Assessment & Plan:  Con was seen today for establish care and medical management of chronic issues.  Diagnoses and all orders for this visit:  Type 2 diabetes mellitus with hyperglycemia, without long-term current use of insulin (HCC) A1C 6.5 in office today. Has significant diarrhea. Will stop metformin to see if beneficial. Continue other medications. Diet and exercise encouraged.  -     Lipid panel -     CMP14+EGFR -     Bayer DCA Hb A1c Waived  Hyperlipidemia associated with type 2 diabetes mellitus ([place]) Diet encouraged - increase intake of fresh fruits and vegetables, increase intake of lean proteins. Bake, broil, or grill foods. Avoid fried, greasy, and fatty foods. Avoid fast foods. Increase intake of fiber-rich whole grains. Exercise encouraged - at least 150 minutes per week and advance as tolerated. Goal BMI < 25. Continue medications as prescribed. Follow up in 3-6 months as discussed.  -     Lipid panel -     CMP14+EGFR -     Bayer DCA Hb A1c Waived  Hypertension associated with type 2 diabetes mellitus (HCC) BP well controlled. Changes were not made in regimen today. Goal BP is 130/80. Pt aware to report any persistent high or low readings. DASH diet and exercise encouraged. Exercise at least 150 minutes per week and increase as  tolerated. Goal BMI > 25. Stress management encouraged. Avoid nicotine and tobacco product use. Avoid excessive alcohol and NSAID's. Avoid more than 2000 mg of sodium daily. Medications as prescribed. Follow up as scheduled.  -     Lipid panel -     CMP14+EGFR -     Bayer DCA Hb A1c Waived  Morbid obesity (HCC) Diet and exercise encouraged. Labs pending. If weight remains same at next visit, will consider changing Ozempic to [facility] for better weight reduction.  -     Lipid panel -     CMP14+EGFR -     Bayer DCA Hb A1c Waived     Continue all other maintenance medications.  Follow up plan: Return in about 3 months (around 07/18/2022), or if symptoms worsen or fail to improve, for all labs.   Continue healthy lifestyle choices, including diet (rich in fruits, vegetables, and lean proteins, and low in salt and simple carbohydrates) and exercise (at least 30 minutes of moderate physical activity daily).  Educational handout given for DM  The above assessment and management plan was discussed with the patient. The patient verbalized understanding of and has agreed to the management plan. Patient is aware to call the clinic if they develop any new symptoms or if symptoms persist or worsen. Patient is aware when to return to the clinic for a follow-up visit. Patient educated on when it is appropriate to go to the emergency department.   Monia Pouch, FNP-C [facility] [phone]

## 2022-04-17 NOTE — Patient Instructions (Signed)

## 2022-04-18 LAB — CMP14+EGFR
ALT: 36 IU/L (ref 0–44)
AST: 25 IU/L (ref 0–40)
Albumin/Globulin Ratio: 1.5 (ref 1.2–2.2)
Albumin: 4.5 g/dL (ref 4.1–5.1)
Alkaline Phosphatase: 62 IU/L (ref 44–121)
BUN/Creatinine Ratio: 18 (ref 9–20)
BUN: 19 mg/dL (ref 6–24)
Bilirubin Total: 0.4 mg/dL (ref 0.0–1.2)
CO2: 25 mmol/L (ref 20–29)
Calcium: 9.2 mg/dL (ref 8.7–10.2)
Chloride: 98 mmol/L (ref 96–106)
Creatinine, Ser: 1.06 mg/dL (ref 0.76–1.27)
Globulin, Total: 3.1 g/dL (ref 1.5–4.5)
Glucose: 123 mg/dL — ABNORMAL HIGH (ref 70–99)
Potassium: 4.2 mmol/L (ref 3.5–5.2)
Sodium: 137 mmol/L (ref 134–144)
Total Protein: 7.6 g/dL (ref 6.0–8.5)
eGFR: 87 mL/min/{1.73_m2} (ref >59)

## 2022-04-18 LAB — LIPID PANEL
Chol/HDL Ratio: 3.2 ratio (ref 0.0–5.0)
Cholesterol, Total: 121 mg/dL (ref 100–199)
HDL: 38 mg/dL — ABNORMAL LOW (ref >39)
LDL Chol Calc (NIH): 43 mg/dL (ref 0–99)
Triglycerides: 258 mg/dL — ABNORMAL HIGH (ref 0–149)
VLDL Cholesterol Cal: 40 mg/dL (ref 5–40)

## 2022-04-20 DIAGNOSIS — Z419 Encounter for procedure for purposes other than remedying health state, unspecified: Secondary | ICD-10-CM | POA: Diagnosis not present

## 2022-05-21 DIAGNOSIS — Z419 Encounter for procedure for purposes other than remedying health state, unspecified: Secondary | ICD-10-CM | POA: Diagnosis not present

## 2022-06-21 DIAGNOSIS — Z419 Encounter for procedure for purposes other than remedying health state, unspecified: Secondary | ICD-10-CM | POA: Diagnosis not present

## 2022-06-28 ENCOUNTER — Other Ambulatory Visit: Payer: Self-pay | Admitting: Family Medicine

## 2022-06-28 DIAGNOSIS — I10 Essential (primary) hypertension: Secondary | ICD-10-CM | POA: Diagnosis not present

## 2022-06-28 DIAGNOSIS — G4733 Obstructive sleep apnea (adult) (pediatric): Secondary | ICD-10-CM | POA: Diagnosis not present

## 2022-06-28 DIAGNOSIS — I201 Angina pectoris with documented spasm: Secondary | ICD-10-CM

## 2022-07-12 ENCOUNTER — Other Ambulatory Visit: Payer: Self-pay | Admitting: Family Medicine

## 2022-07-12 DIAGNOSIS — J302 Other seasonal allergic rhinitis: Secondary | ICD-10-CM

## 2022-07-19 ENCOUNTER — Telehealth: Payer: Self-pay | Admitting: Family Medicine

## 2022-07-19 NOTE — Telephone Encounter (Signed)
Attempted to contact - NVM  When calls back let patient know he does not need to be fasting. b

## 2022-07-20 ENCOUNTER — Ambulatory Visit: Payer: Medicaid Other | Admitting: Family Medicine

## 2022-07-20 DIAGNOSIS — Z419 Encounter for procedure for purposes other than remedying health state, unspecified: Secondary | ICD-10-CM | POA: Diagnosis not present

## 2022-07-20 NOTE — Telephone Encounter (Signed)
Pt r/s apt for today. He is aware that he does not need to fast but then asked to talk to nurse. He says that he thinks there may be miscommunication on this. I told him that Rakes said that lipids would be rechecked at next visit. Please call back

## 2022-07-23 ENCOUNTER — Encounter: Payer: Self-pay | Admitting: Family Medicine

## 2022-07-23 NOTE — Telephone Encounter (Signed)
Returned call and answered questions 

## 2022-08-07 ENCOUNTER — Other Ambulatory Visit: Payer: Self-pay | Admitting: Family Medicine

## 2022-08-07 DIAGNOSIS — E1165 Type 2 diabetes mellitus with hyperglycemia: Secondary | ICD-10-CM

## 2022-08-09 DIAGNOSIS — R070 Pain in throat: Secondary | ICD-10-CM | POA: Diagnosis not present

## 2022-08-09 DIAGNOSIS — J069 Acute upper respiratory infection, unspecified: Secondary | ICD-10-CM | POA: Diagnosis not present

## 2022-08-17 ENCOUNTER — Other Ambulatory Visit: Payer: Self-pay | Admitting: Family Medicine

## 2022-08-17 DIAGNOSIS — E1165 Type 2 diabetes mellitus with hyperglycemia: Secondary | ICD-10-CM

## 2022-08-21 ENCOUNTER — Other Ambulatory Visit: Payer: Self-pay

## 2022-08-21 ENCOUNTER — Other Ambulatory Visit: Payer: Medicaid Other

## 2022-08-21 DIAGNOSIS — E1169 Type 2 diabetes mellitus with other specified complication: Secondary | ICD-10-CM

## 2022-08-21 DIAGNOSIS — E1165 Type 2 diabetes mellitus with hyperglycemia: Secondary | ICD-10-CM | POA: Diagnosis not present

## 2022-08-21 DIAGNOSIS — I152 Hypertension secondary to endocrine disorders: Secondary | ICD-10-CM | POA: Diagnosis not present

## 2022-08-21 DIAGNOSIS — E1159 Type 2 diabetes mellitus with other circulatory complications: Secondary | ICD-10-CM | POA: Diagnosis not present

## 2022-08-21 DIAGNOSIS — E785 Hyperlipidemia, unspecified: Secondary | ICD-10-CM | POA: Diagnosis not present

## 2022-08-21 LAB — BAYER DCA HB A1C WAIVED: HB A1C (BAYER DCA - WAIVED): 6.4 % — ABNORMAL HIGH (ref 4.8–5.6)

## 2022-08-22 LAB — LIPID PANEL
Chol/HDL Ratio: 3.2 ratio (ref 0.0–5.0)
Cholesterol, Total: 133 mg/dL (ref 100–199)
HDL: 41 mg/dL (ref >39)
LDL Chol Calc (NIH): 63 mg/dL (ref 0–99)
Triglycerides: 169 mg/dL — ABNORMAL HIGH (ref 0–149)
VLDL Cholesterol Cal: 29 mg/dL (ref 5–40)

## 2022-08-22 LAB — CMP14+EGFR
ALT: 42 IU/L (ref 0–44)
AST: 28 IU/L (ref 0–40)
Albumin/Globulin Ratio: 1.4 (ref 1.2–2.2)
Albumin: 4.4 g/dL (ref 4.1–5.1)
Alkaline Phosphatase: 66 IU/L (ref 44–121)
BUN/Creatinine Ratio: 22 — ABNORMAL HIGH (ref 9–20)
BUN: 19 mg/dL (ref 6–24)
Bilirubin Total: 0.5 mg/dL (ref 0.0–1.2)
CO2: 23 mmol/L (ref 20–29)
Calcium: 9.3 mg/dL (ref 8.7–10.2)
Chloride: 102 mmol/L (ref 96–106)
Creatinine, Ser: 0.86 mg/dL (ref 0.76–1.27)
Globulin, Total: 3.2 g/dL (ref 1.5–4.5)
Glucose: 131 mg/dL — ABNORMAL HIGH (ref 70–99)
Potassium: 4.7 mmol/L (ref 3.5–5.2)
Sodium: 140 mmol/L (ref 134–144)
Total Protein: 7.6 g/dL (ref 6.0–8.5)
eGFR: 107 mL/min/{1.73_m2} (ref >59)

## 2022-08-22 LAB — CBC WITH DIFFERENTIAL/PLATELET
Basophils Absolute: 0 10*3/uL (ref 0.0–0.2)
Basos: 0 %
EOS (ABSOLUTE): 0.1 10*3/uL (ref 0.0–0.4)
Eos: 2 %
Hematocrit: 41.4 % (ref 37.5–51.0)
Hemoglobin: 14 g/dL (ref 13.0–17.7)
Immature Grans (Abs): 0 10*3/uL (ref 0.0–0.1)
Immature Granulocytes: 0 %
Lymphocytes Absolute: 1.4 10*3/uL (ref 0.7–3.1)
Lymphs: 29 %
MCH: 30 pg (ref 26.6–33.0)
MCHC: 33.8 g/dL (ref 31.5–35.7)
MCV: 89 fL (ref 79–97)
Monocytes Absolute: 0.3 10*3/uL (ref 0.1–0.9)
Monocytes: 5 %
Neutrophils Absolute: 3 10*3/uL (ref 1.4–7.0)
Neutrophils: 64 %
Platelets: 232 10*3/uL (ref 150–450)
RBC: 4.66 x10E6/uL (ref 4.14–5.80)
RDW: 12.3 % (ref 11.6–15.4)
WBC: 4.8 10*3/uL (ref 3.4–10.8)

## 2022-08-22 LAB — MICROALBUMIN / CREATININE URINE RATIO
Creatinine, Urine: 114.7 mg/dL
Microalb/Creat Ratio: 22 mg/g creat (ref 0–29)
Microalbumin, Urine: 25.1 ug/mL

## 2022-08-22 LAB — VITAMIN D 25 HYDROXY (VIT D DEFICIENCY, FRACTURES): Vit D, 25-Hydroxy: 29.7 ng/mL — ABNORMAL LOW (ref 30.0–100.0)

## 2022-08-22 LAB — VITAMIN B12: Vitamin B-12: 944 pg/mL (ref 232–1245)

## 2022-08-23 ENCOUNTER — Ambulatory Visit (INDEPENDENT_AMBULATORY_CARE_PROVIDER_SITE_OTHER): Payer: Medicaid Other | Admitting: Family Medicine

## 2022-08-23 ENCOUNTER — Encounter: Payer: Self-pay | Admitting: Family Medicine

## 2022-08-23 VITALS — BP 131/67 | HR 72 | Temp 97.8°F | Ht 71.0 in | Wt 277.0 lb

## 2022-08-23 DIAGNOSIS — I152 Hypertension secondary to endocrine disorders: Secondary | ICD-10-CM

## 2022-08-23 DIAGNOSIS — E559 Vitamin D deficiency, unspecified: Secondary | ICD-10-CM

## 2022-08-23 DIAGNOSIS — E1159 Type 2 diabetes mellitus with other circulatory complications: Secondary | ICD-10-CM | POA: Diagnosis not present

## 2022-08-23 DIAGNOSIS — I201 Angina pectoris with documented spasm: Secondary | ICD-10-CM | POA: Diagnosis not present

## 2022-08-23 DIAGNOSIS — E785 Hyperlipidemia, unspecified: Secondary | ICD-10-CM

## 2022-08-23 DIAGNOSIS — E1165 Type 2 diabetes mellitus with hyperglycemia: Secondary | ICD-10-CM

## 2022-08-23 DIAGNOSIS — E1169 Type 2 diabetes mellitus with other specified complication: Secondary | ICD-10-CM

## 2022-08-23 MED ORDER — LISINOPRIL 10 MG PO TABS
10.0000 mg | ORAL_TABLET | Freq: Every day | ORAL | 1 refills | Status: DC
Start: 2022-08-23 — End: 2023-03-12

## 2022-08-23 MED ORDER — AMLODIPINE BESYLATE 10 MG PO TABS: 10.0000 mg | ORAL_TABLET | Freq: Every day | ORAL | 1 refills | Status: DC

## 2022-08-23 MED ORDER — METOPROLOL TARTRATE 25 MG PO TABS
25.0000 mg | ORAL_TABLET | Freq: Two times a day (BID) | ORAL | 1 refills | Status: DC
Start: 2022-08-23 — End: 2023-04-08

## 2022-08-23 MED ORDER — DAPAGLIFLOZIN PROPANEDIOL 10 MG PO TABS
10.0000 mg | ORAL_TABLET | Freq: Every day | ORAL | 1 refills | Status: DC
Start: 2022-08-23 — End: 2023-03-12

## 2022-08-23 NOTE — Progress Notes (Signed)
Subjective:  Patient ID: Jimmy Tyler, male    DOB: [birth year], 47 y.o.   MRN: [id number]  Patient Care Team: Baruch Gouty, FNP as PCP - General (Family Medicine) Satira Sark, MD as PCP - Cardiology (Cardiology) Eloise Harman, DO as Consulting Physician (Internal Medicine) Harlen Labs, MD as Referring Physician (Optometry)   Chief Complaint:  Medical Management of Chronic Issues (3 month chronic )   HPI: Jimmy Tyler is a 47 y.o. male presenting on 08/23/2022 for Medical Management of Chronic Issues (3 month chronic )   1. Type 2 diabetes mellitus with hyperglycemia, without long-term current use of insulin Compliant with medication regimen. Denies polyuria, polyphagia, or polydipsia. Does report feeling fatigue and malaise. No neuropathy reported. Labs reviewed with pt today.   2. Hyperlipidemia associated with type 2 diabetes mellitus On Nexlizet and tolerating well. Does not follow a diet or exercise routine.   3. Hypertension associated with type 2 diabetes mellitus Complaint with meds - Yes Current Medications - lisinopril, norvasc, and lopressor Checking BP at home - no Exercising Regularly - No Watching Salt intake - Yes Pertinent ROS:  Headache - No Fatigue - No Visual Disturbances - No Chest pain - No Dyspnea - No Palpitations - No LE edema - No They report good compliance with medications and can restate their regimen by memory. No medication side effects.  BP Readings from Last 3 Encounters:  08/23/22 131/67  04/17/22 121/80  01/12/22 123/78    4. Morbid obesity Does not follow a diet or exercise routine. Has had a gastric sleeve in the past but gained his weight back.   5. Vitamin D deficiency Has not started taking vitamin D repletion therapy. Advised to. Denies arthralgias, trouble walking, or recent fractures.   6. Coronary vasospasm No anginal symptoms. Taking BB and CCB as prescribed. Has not followed up with cardiology  in years.      Relevant past medical, surgical, family, and social history reviewed and updated as indicated.  Allergies and medications reviewed and updated. Data reviewed: Chart in Epic.   Past Medical History:  Diagnosis Date   Allergy    Asthma    Diabetes mellitus without complication    GERD (gastroesophageal reflux disease)    History of Clostridium difficile infection    Hyperlipidemia    Hypertension    Myocardial infarction 2014   OSA on CPAP    Testicular hypofunction    Umbilical hernia     Past Surgical History:  Procedure Laterality Date   BARIATRIC SURGERY  07/13/2013   BIOPSY  07/19/2020   Procedure: BIOPSY;  Surgeon: Eloise Harman, DO;  Location: [facility];  Service: Endoscopy;;   COLONOSCOPY WITH PROPOFOL N/A 07/19/2020   Procedure: COLONOSCOPY WITH PROPOFOL;  Surgeon: Eloise Harman, DO;  Location: [facility];  Service: Endoscopy;  Laterality: N/A;  early am appt   ESOPHAGOGASTRODUODENOSCOPY (EGD) WITH PROPOFOL N/A 07/19/2020   Procedure: ESOPHAGOGASTRODUODENOSCOPY (EGD) WITH PROPOFOL;  Surgeon: Eloise Harman, DO;  Location: [facility];  Service: Endoscopy;  Laterality: N/A;   HIATAL HERNIA REPAIR     KNEE SURGERY Left 2015   Due to meniscal tear   LEFT HEART CATH AND CORONARY ANGIOGRAPHY N/A 07/28/2020   Procedure: LEFT HEART CATH AND CORONARY ANGIOGRAPHY;  Surgeon: Leonie Man, MD;  Location: [facility];  Service: Cardiovascular;  Laterality: N/A;   NASAL SEPTUM SURGERY  03/2019   POLYPECTOMY  07/19/2020   Procedure: POLYPECTOMY;  Surgeon: Eloise Harman, DO;  Location: [facility];  Service: Endoscopy;;    Social History   Socioeconomic History   Marital status: Divorced    Spouse name: Not on file   Number of children: Not on file   Years of education: Not on file   Highest education level: Not on file  Occupational History   Not on file  Tobacco Use   Smoking status: Former    Packs/day: 1    Types:  Cigarettes    Quit date: 02/04/2014    Years since quitting: 8.5   Smokeless tobacco: Never  Vaping Use   Vaping Use: Never used  Substance and Sexual Activity   Alcohol use: Yes    Comment: very rarely    Drug use: Never   Sexual activity: Yes    Birth control/protection: None  Other Topics Concern   Not on file  Social History Narrative   Not on file   Social Determinants of Health   Financial Resource Strain: Not on file  Food Insecurity: Not on file  Transportation Needs: Not on file  Physical Activity: Not on file  Stress: Not on file  Social Connections: Not on file  Intimate Partner Violence: Not on file    Outpatient Encounter Medications as of 08/23/2022  Medication Sig   Bempedoic Acid-Ezetimibe (NEXLIZET) 180-10 MG TABS Take 1 tablet by mouth daily.   Famotidine (PEPCID PO) Take 20 mg by mouth at bedtime.   fluticasone (FLONASE) 50 MCG/ACT nasal spray Place 2 sprays into both nostrils daily.   levocetirizine (XYZAL) 5 MG tablet TAKE 1 TABLET BY MOUTH ONCE DAILY IN THE EVENING   loperamide (IMODIUM) 2 MG capsule Take 6-8 mg by mouth daily as needed for diarrhea or loose stools.   OZEMPIC, 1 MG/DOSE, 4 MG/3ML SOPN INJECT 1 MG SUBCUTANEOUSLY ONCE A WEEK   [DISCONTINUED] amLODipine (NORVASC) 10 MG tablet Take 1 tablet by mouth once daily   [DISCONTINUED] dapagliflozin propanediol (FARXIGA) 10 MG TABS tablet Take 1 tablet (10 mg total) by mouth daily. (NEEDS TO BE SEEN BEFORE NEXT REFILL)   [DISCONTINUED] lisinopril (ZESTRIL) 10 MG tablet Take 1 tablet (10 mg total) by mouth daily.   [DISCONTINUED] metoprolol tartrate (LOPRESSOR) 25 MG tablet Take 1 tablet (25 mg total) by mouth 2 (two) times daily.   amLODipine (NORVASC) 10 MG tablet Take 1 tablet (10 mg total) by mouth daily.   dapagliflozin propanediol (FARXIGA) 10 MG TABS tablet Take 1 tablet (10 mg total) by mouth daily.   lisinopril (ZESTRIL) 10 MG tablet Take 1 tablet (10 mg total) by mouth daily.   metoprolol  tartrate (LOPRESSOR) 25 MG tablet Take 1 tablet (25 mg total) by mouth 2 (two) times daily.   No facility-administered encounter medications on file as of 08/23/2022.    Allergies  Allergen Reactions   Other Other (See Comments)    Body aches.  Failed 3 different ones.   Gramineae Pollens    Statins Other (See Comments)    Body aches.  Failed 3 different ones. Body aches.  Failed 3 different ones. Body aches.  Failed 3 different ones. Other reaction(s): Other (See Comments) Body aches.  Failed 3 different ones. Body aches.  Failed 3 different ones.    Review of Systems  Constitutional:  Positive for fatigue. Negative for activity change, appetite change, chills, diaphoresis, fever and unexpected weight change.  HENT: Negative.    Eyes: Negative.  Negative for photophobia  and visual disturbance.  Respiratory:  Negative for cough, chest tightness and shortness of breath.   Cardiovascular:  Negative for chest pain, palpitations and leg swelling.  Gastrointestinal:  Negative for abdominal pain, blood in stool, constipation, diarrhea, nausea and vomiting.  Endocrine: Negative.  Negative for polydipsia, polyphagia and polyuria.  Genitourinary:  Negative for decreased urine volume, difficulty urinating, dysuria, frequency and urgency.  Musculoskeletal:  Negative for arthralgias and myalgias.  Skin: Negative.   Allergic/Immunologic: Negative.   Neurological:  Negative for dizziness, tremors, seizures, syncope, facial asymmetry, speech difficulty, weakness, light-headedness, numbness and headaches.  Hematological: Negative.   Psychiatric/Behavioral:  Negative for confusion, hallucinations, sleep disturbance and suicidal ideas.   All other systems reviewed and are negative.       Objective:  BP 131/67   Pulse 72   Temp 97.8 F (36.6 C) (Temporal)   Ht 5\' 11"  (1.803 m)   Wt 277 lb (125.6 kg)   SpO2 95%   BMI 38.63 kg/m    Wt Readings from Last 3 Encounters:  08/23/22 277 lb  (125.6 kg)  04/17/22 277 lb 3.2 oz (125.7 kg)  01/12/22 279 lb 12.8 oz (126.9 kg)    Physical Exam Vitals and nursing note reviewed.  Constitutional:      General: He is not in acute distress.    Appearance: Normal appearance. He is well-developed and well-groomed. He is not ill-appearing, toxic-appearing or diaphoretic.  HENT:     Head: Normocephalic and atraumatic.     Jaw: There is normal jaw occlusion.     Right Ear: Hearing normal.     Left Ear: Hearing normal.     Nose: Nose normal.     Mouth/Throat:     Lips: Pink.     Mouth: Mucous membranes are moist.     Pharynx: Oropharynx is clear. Uvula midline.  Eyes:     General: Lids are normal.     Extraocular Movements: Extraocular movements intact.     Conjunctiva/sclera: Conjunctivae normal.     Pupils: Pupils are equal, round, and reactive to light.  Neck:     Thyroid: No thyroid mass, thyromegaly or thyroid tenderness.     Vascular: No carotid bruit or JVD.     Trachea: Trachea and phonation normal.  Cardiovascular:     Rate and Rhythm: Normal rate and regular rhythm.     Chest Wall: PMI is not displaced.     Pulses: Normal pulses.     Heart sounds: Normal heart sounds. No murmur heard.    No friction rub. No gallop.  Pulmonary:     Effort: Pulmonary effort is normal. No respiratory distress.     Breath sounds: Normal breath sounds. No wheezing.  Abdominal:     General: Bowel sounds are normal. There is no distension or abdominal bruit.     Palpations: Abdomen is soft. There is no hepatomegaly or splenomegaly.     Tenderness: There is no abdominal tenderness. There is no right CVA tenderness or left CVA tenderness.     Hernia: No hernia is present.  Musculoskeletal:        General: Normal range of motion.     Cervical back: Normal range of motion and neck supple.     Right lower leg: No edema.     Left lower leg: No edema.  Lymphadenopathy:     Cervical: No cervical adenopathy.  Skin:    General: Skin is warm  and dry.     Capillary Refill: Capillary  refill takes less than 2 seconds.     Coloration: Skin is not cyanotic, jaundiced or pale.     Findings: No rash.  Neurological:     General: No focal deficit present.     Mental Status: He is alert and oriented to person, place, and time.     Sensory: Sensation is intact.     Motor: Motor function is intact.     Coordination: Coordination is intact.     Gait: Gait is intact.     Deep Tendon Reflexes: Reflexes are normal and symmetric.  Psychiatric:        Attention and Perception: Attention and perception normal.        Mood and Affect: Mood and affect normal.        Speech: Speech normal.        Behavior: Behavior normal. Behavior is cooperative.        Thought Content: Thought content normal.        Cognition and Memory: Cognition and memory normal.        Judgment: Judgment normal.     Results for orders placed or performed in visit on 08/21/22  Bayer DCA Hb A1c Waived  Result Value Ref Range   HB A1C (BAYER DCA - WAIVED) 6.4 (H) 4.8 - 5.6 %  Lipid panel  Result Value Ref Range   Cholesterol, Total 133 100 - 199 mg/dL   Triglycerides 169 (H) 0 - 149 mg/dL   HDL 41 >39 mg/dL   VLDL Cholesterol Cal 29 5 - 40 mg/dL   LDL Chol Calc (NIH) 63 0 - 99 mg/dL   Chol/HDL Ratio 3.2 0.0 - 5.0 ratio  CBC with Differential/Platelet  Result Value Ref Range   WBC 4.8 3.4 - 10.8 x10E3/uL   RBC 4.66 4.14 - 5.80 x10E6/uL   Hemoglobin 14.0 13.0 - 17.7 g/dL   Hematocrit 41.4 37.5 - 51.0 %   MCV 89 79 - 97 fL   MCH 30.0 26.6 - 33.0 pg   MCHC 33.8 31.5 - 35.7 g/dL   RDW 12.3 11.6 - 15.4 %   Platelets 232 150 - 450 x10E3/uL   Neutrophils 64 Not Estab. %   Lymphs 29 Not Estab. %   Monocytes 5 Not Estab. %   Eos 2 Not Estab. %   Basos 0 Not Estab. %   Neutrophils Absolute 3.0 1.4 - 7.0 x10E3/uL   Lymphocytes Absolute 1.4 0.7 - 3.1 x10E3/uL   Monocytes Absolute 0.3 0.1 - 0.9 x10E3/uL   EOS (ABSOLUTE) 0.1 0.0 - 0.4 x10E3/uL   Basophils Absolute  0.0 0.0 - 0.2 x10E3/uL   Immature Granulocytes 0 Not Estab. %   Immature Grans (Abs) 0.0 0.0 - 0.1 x10E3/uL  CMP14+EGFR  Result Value Ref Range   Glucose 131 (H) 70 - 99 mg/dL   BUN 19 6 - 24 mg/dL   Creatinine, Ser 0.86 0.76 - 1.27 mg/dL   eGFR 107 >59 mL/min/1.73   BUN/Creatinine Ratio 22 (H) 9 - 20   Sodium 140 134 - 144 mmol/L   Potassium 4.7 3.5 - 5.2 mmol/L   Chloride 102 96 - 106 mmol/L   CO2 23 20 - 29 mmol/L   Calcium 9.3 8.7 - 10.2 mg/dL   Total Protein 7.6 6.0 - 8.5 g/dL   Albumin 4.4 4.1 - 5.1 g/dL   Globulin, Total 3.2 1.5 - 4.5 g/dL   Albumin/Globulin Ratio 1.4 1.2 - 2.2   Bilirubin Total 0.5 0.0 - 1.2 mg/dL  Alkaline Phosphatase 66 44 - 121 IU/L   AST 28 0 - 40 IU/L   ALT 42 0 - 44 IU/L  Microalbumin / creatinine urine ratio  Result Value Ref Range   Creatinine, Urine 114.7 Not Estab. mg/dL   Microalbumin, Urine 25.1 Not Estab. ug/mL   Microalb/Creat Ratio 22 0 - 29 mg/g creat  Vitamin B12  Result Value Ref Range   Vitamin B-12 944 232 - 1,245 pg/mL  VITAMIN D 25 Hydroxy (Vit-D Deficiency, Fractures)  Result Value Ref Range   Vit D, 25-Hydroxy 29.7 (L) 30.0 - 100.0 ng/mL       Pertinent labs & imaging results that were available during my care of the patient were reviewed by me and considered in my medical decision making.  Assessment & Plan:  Latonio was seen today for medical management of chronic issues.  Diagnoses and all orders for this visit:  Type 2 diabetes mellitus with hyperglycemia, without long-term current use of insulin A1C 6.4, well controlled. Continue current regimen. Diet and exercise encouraged.  -     dapagliflozin propanediol (FARXIGA) 10 MG TABS tablet; Take 1 tablet (10 mg total) by mouth daily. -     lisinopril (ZESTRIL) 10 MG tablet; Take 1 tablet (10 mg total) by mouth daily.  Hyperlipidemia associated with type 2 diabetes mellitus  Hypertension associated with type 2 diabetes mellitus BP well controlled. Changes were not  made in regimen today. Goal BP is 130/80. Pt aware to report any persistent high or low readings. DASH diet and exercise encouraged. Exercise at least 150 minutes per week and increase as tolerated. Goal BMI > 25. Stress management encouraged. Avoid nicotine and tobacco product use. Avoid excessive alcohol and NSAID's. Avoid more than 2000 mg of sodium daily. Medications as prescribed. Follow up as scheduled.  -     amLODipine (NORVASC) 10 MG tablet; Take 1 tablet (10 mg total) by mouth daily. -     lisinopril (ZESTRIL) 10 MG tablet; Take 1 tablet (10 mg total) by mouth daily. -     metoprolol tartrate (LOPRESSOR) 25 MG tablet; Take 1 tablet (25 mg total) by mouth 2 (two) times daily.  Morbid obesity Diet and exercise encouraged. Labs reviewed with pt today.   Vitamin D deficiency Labs reviewed, pt aware to take 1000-2000 iu daily, this is over the counter.   Coronary vasospasm No anginal symptoms reported. Advised to keep regular follow up with cardiology, at least yearly.  -     amLODipine (NORVASC) 10 MG tablet; Take 1 tablet (10 mg total) by mouth daily.     Continue all other maintenance medications.  Follow up plan: Return in about 3 months (around 11/22/2022), or if symptoms worsen or fail to improve, for DM.   Continue healthy lifestyle choices, including diet (rich in fruits, vegetables, and lean proteins, and low in salt and simple carbohydrates) and exercise (at least 30 minutes of moderate physical activity daily).  Educational handout given for DM  The above assessment and management plan was discussed with the patient. The patient verbalized understanding of and has agreed to the management plan. Patient is aware to call the clinic if they develop any new symptoms or if symptoms persist or worsen. Patient is aware when to return to the clinic for a follow-up visit. Patient educated on when it is appropriate to go to the emergency department.   Monia Pouch, FNP-C [facility] [phone]

## 2022-08-23 NOTE — Patient Instructions (Signed)

## 2022-09-06 ENCOUNTER — Other Ambulatory Visit: Payer: Self-pay | Admitting: Family Medicine

## 2022-09-06 DIAGNOSIS — E782 Mixed hyperlipidemia: Secondary | ICD-10-CM

## 2022-09-11 ENCOUNTER — Other Ambulatory Visit: Payer: Self-pay | Admitting: Family Medicine

## 2022-09-11 DIAGNOSIS — E1165 Type 2 diabetes mellitus with hyperglycemia: Secondary | ICD-10-CM

## 2022-10-08 ENCOUNTER — Telehealth: Payer: Self-pay

## 2022-10-08 NOTE — Telephone Encounter (Signed)
Fax sent to the plan Your PA has been faxed to the plan as a paper copy. Please contact the plan directly if you haven't received a determination in a typical timeframe.  You will be notified of the determination electronically and via fax. How do I know if the plan approved the PA?  Add Reminder to your Dashboard Remind me in:  5 business days Contact plan to follow up on [id number]

## 2022-10-08 NOTE — Telephone Encounter (Signed)
Jimmy Tyler (Key: [id number]) Rx #: [id number] Ozempic (1 MG/DOSE) 4MG /3ML pen-injectors  Sent to plan through cover my meds

## 2022-10-31 ENCOUNTER — Telehealth: Payer: Self-pay | Admitting: Family Medicine

## 2022-10-31 ENCOUNTER — Other Ambulatory Visit (HOSPITAL_COMMUNITY): Payer: Self-pay

## 2022-10-31 ENCOUNTER — Telehealth: Payer: Self-pay | Admitting: *Deleted

## 2022-10-31 NOTE — Telephone Encounter (Signed)
Ozempic approved.  

## 2022-10-31 NOTE — Telephone Encounter (Signed)
Left detailed message of what nurse stated and to call back to schedule an appt.

## 2022-10-31 NOTE — Telephone Encounter (Signed)
Prior auth submitted through [employer] Portal  PA (EOC) Id#   [id number]

## 2022-10-31 NOTE — Telephone Encounter (Signed)
Patient calling back to check on his PA and informed that his insurance had changed since he was last here. Needs the PA resent to his new insurance. He was not aware that he needed to let us know that the insurance had changed

## 2022-11-01 NOTE — Telephone Encounter (Signed)
Patient aware and verbalized understanding. Appt made 

## 2022-11-01 NOTE — Telephone Encounter (Signed)
Ok to take.   Gaelyn Tukes, FNP  

## 2022-11-30 ENCOUNTER — Ambulatory Visit: Payer: Self-pay | Admitting: Family Medicine

## 2022-12-03 ENCOUNTER — Encounter: Payer: Self-pay | Admitting: Family

## 2022-12-03 ENCOUNTER — Ambulatory Visit: Payer: BC Managed Care – PPO | Admitting: Family

## 2022-12-03 VITALS — BP 127/69 | HR 81 | Temp 97.0°F | Ht 70.0 in | Wt 272.0 lb

## 2022-12-03 DIAGNOSIS — Z7985 Long-term (current) use of injectable non-insulin antidiabetic drugs: Secondary | ICD-10-CM | POA: Diagnosis not present

## 2022-12-03 DIAGNOSIS — E1165 Type 2 diabetes mellitus with hyperglycemia: Secondary | ICD-10-CM | POA: Diagnosis not present

## 2022-12-03 DIAGNOSIS — G4733 Obstructive sleep apnea (adult) (pediatric): Secondary | ICD-10-CM

## 2022-12-03 DIAGNOSIS — E1169 Type 2 diabetes mellitus with other specified complication: Secondary | ICD-10-CM

## 2022-12-03 DIAGNOSIS — I252 Old myocardial infarction: Secondary | ICD-10-CM

## 2022-12-03 DIAGNOSIS — I152 Hypertension secondary to endocrine disorders: Secondary | ICD-10-CM

## 2022-12-03 DIAGNOSIS — I201 Angina pectoris with documented spasm: Secondary | ICD-10-CM

## 2022-12-03 DIAGNOSIS — E559 Vitamin D deficiency, unspecified: Secondary | ICD-10-CM

## 2022-12-03 DIAGNOSIS — R5383 Other fatigue: Secondary | ICD-10-CM

## 2022-12-03 DIAGNOSIS — Z0001 Encounter for general adult medical examination with abnormal findings: Secondary | ICD-10-CM | POA: Diagnosis not present

## 2022-12-03 DIAGNOSIS — Z0000 Encounter for general adult medical examination without abnormal findings: Secondary | ICD-10-CM

## 2022-12-03 DIAGNOSIS — Z1159 Encounter for screening for other viral diseases: Secondary | ICD-10-CM

## 2022-12-03 DIAGNOSIS — K219 Gastro-esophageal reflux disease without esophagitis: Secondary | ICD-10-CM | POA: Insufficient documentation

## 2022-12-03 DIAGNOSIS — Z9884 Bariatric surgery status: Secondary | ICD-10-CM | POA: Insufficient documentation

## 2022-12-03 DIAGNOSIS — G720 Drug-induced myopathy: Secondary | ICD-10-CM

## 2022-12-03 MED ORDER — SEMAGLUTIDE (2 MG/DOSE) 8 MG/3ML ~~LOC~~ SOPN
2.0000 mg | PEN_INJECTOR | SUBCUTANEOUS | 1 refills | Status: DC
Start: 2022-12-03 — End: 2023-01-21

## 2022-12-03 NOTE — Progress Notes (Signed)
Subjective:    Patient ID: Jimmy Tyler, male    DOB: June 12, 1975, 47 y.o.   MRN: [id number]  Chief Complaint  Patient presents with   Medical Management of Chronic Issues    Switching from rakes.    Fatigue    Patient does have sleep apnea last sleep study about five years ago    Pt presents to the office today for CPE.   Has hx of MI. Is not followed by Cardiologists.   Pt states he can not tolerate statins because of myalgia.    Has OSA and uses CPAP nightly.   He is morbid obese with BMI of 39 with DM and HTN.   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      12/03/2022    3:47 PM 08/23/2022   11:21 AM 04/17/2022    3:51 PM  Last 3 Weights  Weight (lbs) 272 lb 277 lb 277 lb 3.2 oz  Weight (kg) 123.378 kg 125.646 kg 125.737 kg     Hypertension This is a chronic problem. The current episode started more than 1 year ago. The problem has been resolved since onset. The problem is controlled. Associated symptoms include malaise/fatigue. Pertinent negatives include no blurred vision, peripheral edema or shortness of breath. Risk factors for coronary artery disease include dyslipidemia, diabetes mellitus, obesity, male gender and sedentary lifestyle. Past treatments include calcium channel blockers. The current treatment provides moderate improvement.  Diabetes He presents for his follow-up diabetic visit. He has type 2 diabetes mellitus. Pertinent negatives for diabetes include no blurred vision and no foot paresthesias. Symptoms are stable. Risk factors for coronary artery disease include dyslipidemia, diabetes mellitus, hypertension, sedentary lifestyle and post-menopausal. He is following a generally healthy diet. (Does not check glucose at home) An ACE inhibitor/angiotensin II receptor blocker is being taken.  Hyperlipidemia This is a chronic problem. The current episode started more than 1 year ago. The problem is controlled. Recent lipid  tests were reviewed and are normal. Exacerbating diseases include obesity. Pertinent negatives include no shortness of breath. Current antihyperlipidemic treatment includes diet change and ezetimibe. The current treatment provides mild improvement of lipids. Risk factors for coronary artery disease include dyslipidemia, diabetes mellitus, hypertension, male sex and obesity.  Gastroesophageal Reflux He complains of belching and heartburn. This is a chronic problem. The current episode started more than 1 year ago. The problem occurs occasionally. Risk factors include obesity. He has tried a PPI for the symptoms. The treatment provided moderate relief.      Review of Systems  Constitutional:  Positive for malaise/fatigue.  Eyes:  Negative for blurred vision.  Respiratory:  Negative for shortness of breath.   Gastrointestinal:  Positive for heartburn.  All other systems reviewed and are negative.  Family History  Problem Relation Age of Onset   COPD Mother    Autism Son    Skin cancer Maternal Grandmother    Heart attack Maternal Grandfather    Social History   Socioeconomic History   Marital status: Divorced    Spouse name: Not on file   Number of children: Not on file   Years of education: Not on file   Highest education level: Not on file  Occupational History   Not on file  Tobacco Use   Smoking status: Former    Current packs/day: 0.00    Types: Cigarettes    Quit date: 02/04/2014    Years since quitting: 8.8  Smokeless tobacco: Never  Vaping Use   Vaping status: Never Used  Substance and Sexual Activity   Alcohol use: Yes    Comment: very rarely    Drug use: Never   Sexual activity: Yes    Birth control/protection: None  Other Topics Concern   Not on file  Social History Narrative   Not on file   Social Determinants of Health   Financial Resource Strain: Not on file  Food Insecurity: Not on file  Transportation Needs: Not on file  Physical Activity: Not on  file  Stress: Not on file  Social Connections: Not on file       Objective:   Physical Exam Vitals reviewed.  Constitutional:      General: He is not in acute distress.    Appearance: He is well-developed. He is obese.  HENT:     Head: Normocephalic.     Right Ear: Tympanic membrane normal.     Left Ear: Tympanic membrane normal.  Eyes:     General:        Right eye: No discharge.        Left eye: No discharge.     Pupils: Pupils are equal, round, and reactive to light.  Neck:     Thyroid: No thyromegaly.  Cardiovascular:     Rate and Rhythm: Normal rate and regular rhythm.     Heart sounds: Normal heart sounds. No murmur heard. Pulmonary:     Effort: Pulmonary effort is normal. No respiratory distress.     Breath sounds: Normal breath sounds. No wheezing.  Abdominal:     General: Bowel sounds are normal. There is no distension.     Palpations: Abdomen is soft.     Tenderness: There is no abdominal tenderness.  Musculoskeletal:        General: No tenderness. Normal range of motion.     Cervical back: Normal range of motion and neck supple.  Skin:    General: Skin is warm and dry.     Findings: No erythema or rash.  Neurological:     Mental Status: He is alert and oriented to person, place, and time.     Cranial Nerves: No cranial nerve deficit.     Deep Tendon Reflexes: Reflexes are normal and symmetric.  Psychiatric:        Behavior: Behavior normal.        Thought Content: Thought content normal.        Judgment: Judgment normal.       BP 127/69   Pulse 81   Temp (!) 97 F (36.1 C) (Temporal)   Ht 5\' 10"  (1.778 m)   Wt 272 lb (123.4 kg)   BMI 39.03 kg/m      Assessment & Plan:  Jimmy Tyler comes in today with chief complaint of Medical Management of Chronic Issues (Switching from rakes. ) and Fatigue (Patient does have sleep apnea last sleep study about five years ago )   Diagnosis and orders addressed:  1. Type 2 diabetes mellitus with  hyperglycemia, without long-term current use of insulin (HCC) -Ozempic increased to 2 mg from 1 mg  Low carb diet  - Semaglutide, 2 MG/DOSE, 8 MG/3ML SOPN; Inject 2 mg as directed once a week.  Dispense: 3 mL; Refill: 1 - Bayer DCA Hb A1c Waived - CMP14+EGFR  2. Morbid obesity (HCC) - CMP14+EGFR  3. Hypertension associated with type 2 diabetes mellitus (HCC) - CMP14+EGFR  4. Hyperlipidemia associated with type 2  diabetes mellitus (HCC) - CMP14+EGFR  5. History of myocardial infarction - CMP14+EGFR  6. Obstructive sleep apnea of adult - CMP14+EGFR  7. Vitamin D deficiency - CMP14+EGFR  8. S/P laparoscopic sleeve gastrectomy - CMP14+EGFR  9. Gastroesophageal reflux disease without esophagitis - CMP14+EGFR  10. Statin myopathy - CMP14+EGFR  11. Coronary vasospasm (HCC)  - CMP14+EGFR  12. Annual physical exam - Bayer DCA Hb A1c Waived - CMP14+EGFR - Anemia Profile B - TSH - VITAMIN D 25 Hydroxy (Vit-D Deficiency, Fractures) - Hepatitis C antibody - Testosterone,Free and Total; Future  13. Other fatigue - CMP14+EGFR - Anemia Profile B - TSH - VITAMIN D 25 Hydroxy (Vit-D Deficiency, Fractures) - Testosterone,Free and Total; Future  14. OSA (obstructive sleep apnea)  15. Need for hepatitis C screening test - Hepatitis C antibody   Labs pending Increase Ozempic to 2 mg from 1 mg  Health Maintenance reviewed Diet and exercise encouraged  Follow up plan: 3 months   Jannifer Rodney, FNP

## 2022-12-03 NOTE — Patient Instructions (Signed)
Health Maintenance, Male Adopting a healthy lifestyle and getting preventive care are important in promoting health and wellness. Ask your health care provider about: The right schedule for you to have regular tests and exams. Things you can do on your own to prevent diseases and keep yourself healthy. What should I know about diet, weight, and exercise? Eat a healthy diet  Eat a diet that includes plenty of vegetables, fruits, low-fat dairy products, and lean protein. Do not eat a lot of foods that are high in solid fats, added sugars, or sodium. Maintain a healthy weight Body mass index (BMI) is a measurement that can be used to identify possible weight problems. It estimates body fat based on height and weight. Your health care provider can help determine your BMI and help you achieve or maintain a healthy weight. Get regular exercise Get regular exercise. This is one of the most important things you can do for your health. Most adults should: Exercise for at least 150 minutes each week. The exercise should increase your heart rate and make you sweat (moderate-intensity exercise). Do strengthening exercises at least twice a week. This is in addition to the moderate-intensity exercise. Spend less time sitting. Even light physical activity can be beneficial. Watch cholesterol and blood lipids Have your blood tested for lipids and cholesterol at 49 years of age, then have this test every 5 years. You may need to have your cholesterol levels checked more often if: Your lipid or cholesterol levels are high. You are older than 49 years of age. You are at high risk for heart disease. What should I know about cancer screening? Many types of cancers can be detected early and may often be prevented. Depending on your health history and family history, you may need to have cancer screening at various ages. This may include screening for: Colorectal cancer. Prostate cancer. Skin cancer. Lung  cancer. What should I know about heart disease, diabetes, and high blood pressure? Blood pressure and heart disease High blood pressure causes heart disease and increases the risk of stroke. This is more likely to develop in people who have high blood pressure readings or are overweight. Talk with your health care provider about your target blood pressure readings. Have your blood pressure checked: Every 3-5 years if you are 18-39 years of age. Every year if you are 40 years old or older. If you are between the ages of 65 and 75 and are a current or former smoker, ask your health care provider if you should have a one-time screening for abdominal aortic aneurysm (AAA). Diabetes Have regular diabetes screenings. This checks your fasting blood sugar level. Have the screening done: Once every three years after age 45 if you are at a normal weight and have a low risk for diabetes. More often and at a younger age if you are overweight or have a high risk for diabetes. What should I know about preventing infection? Hepatitis B If you have a higher risk for hepatitis B, you should be screened for this virus. Talk with your health care provider to find out if you are at risk for hepatitis B infection. Hepatitis C Blood testing is recommended for: Everyone born from 1945 through 1965. Anyone with known risk factors for hepatitis C. Sexually transmitted infections (STIs) You should be screened each year for STIs, including gonorrhea and chlamydia, if: You are sexually active and are younger than 49 years of age. You are older than 49 years of age and your   health care provider tells you that you are at risk for this type of infection. Your sexual activity has changed since you were last screened, and you are at increased risk for chlamydia or gonorrhea. Ask your health care provider if you are at risk. Ask your health care provider about whether you are at high risk for HIV. Your health care provider  may recommend a prescription medicine to help prevent HIV infection. If you choose to take medicine to prevent HIV, you should first get tested for HIV. You should then be tested every 3 months for as long as you are taking the medicine. Follow these instructions at home: Alcohol use Do not drink alcohol if your health care provider tells you not to drink. If you drink alcohol: Limit how much you have to 0-2 drinks a day. Know how much alcohol is in your drink. In the [place] one drink equals one 12 oz bottle of beer (355 mL), one 5 oz glass of wine (148 mL), or one 1 oz glass of hard liquor (44 mL). Lifestyle Do not use any products that contain nicotine or tobacco. These products include cigarettes, chewing tobacco, and vaping devices, such as e-cigarettes. If you need help quitting, ask your health care provider. Do not use street drugs. Do not share needles. Ask your health care provider for help if you need support or information about quitting drugs. General instructions Schedule regular health, dental, and eye exams. Stay current with your vaccines. Tell your health care provider if: You often feel depressed. You have ever been abused or do not feel safe at home. Summary Adopting a healthy lifestyle and getting preventive care are important in promoting health and wellness. Follow your health care provider's instructions about healthy diet, exercising, and getting tested or screened for diseases. Follow your health care provider's instructions on monitoring your cholesterol and blood pressure. This information is not intended to replace advice given to you by your health care provider. Make sure you discuss any questions you have with your health care provider. Document Revised: 09/26/2020 Document Reviewed: 09/26/2020 Elsevier Patient Education  2024 [employer]vier Inc.  

## 2022-12-04 LAB — ANEMIA PROFILE B
Basophils Absolute: 0.1 10*3/uL (ref 0.0–0.2)
Basos: 1 %
EOS (ABSOLUTE): 0.1 10*3/uL (ref 0.0–0.4)
Eos: 3 %
Ferritin: 143 ng/mL (ref 30–400)
Folate: >20 ng/mL (ref >3.0)
Hematocrit: 40.4 % (ref 37.5–51.0)
Hemoglobin: 13.9 g/dL (ref 13.0–17.7)
Immature Grans (Abs): 0 10*3/uL (ref 0.0–0.1)
Immature Granulocytes: 0 %
Iron Saturation: 26 % (ref 15–55)
Iron: 76 ug/dL (ref 38–169)
Lymphocytes Absolute: 1.6 10*3/uL (ref 0.7–3.1)
Lymphs: 31 %
MCH: 31.2 pg (ref 26.6–33.0)
MCHC: 34.4 g/dL (ref 31.5–35.7)
MCV: 91 fL (ref 79–97)
Monocytes Absolute: 0.4 10*3/uL (ref 0.1–0.9)
Monocytes: 7 %
Neutrophils Absolute: 3 10*3/uL (ref 1.4–7.0)
Neutrophils: 58 %
Platelets: 239 10*3/uL (ref 150–450)
RBC: 4.46 x10E6/uL (ref 4.14–5.80)
RDW: 12.8 % (ref 11.6–15.4)
Retic Ct Pct: 1.6 % (ref 0.6–2.6)
Total Iron Binding Capacity: 293 ug/dL (ref 250–450)
UIBC: 217 ug/dL (ref 111–343)
Vitamin B-12: 1049 pg/mL (ref 232–1245)
WBC: 5.1 10*3/uL (ref 3.4–10.8)

## 2022-12-04 LAB — VITAMIN D 25 HYDROXY (VIT D DEFICIENCY, FRACTURES): Vit D, 25-Hydroxy: 41.6 ng/mL (ref 30.0–100.0)

## 2022-12-04 LAB — CMP14+EGFR
ALT: 37 IU/L (ref 0–44)
AST: 26 IU/L (ref 0–40)
Albumin: 4.3 g/dL (ref 4.1–5.1)
Alkaline Phosphatase: 64 IU/L (ref 44–121)
BUN/Creatinine Ratio: 15 (ref 9–20)
BUN: 14 mg/dL (ref 6–24)
Bilirubin Total: 0.4 mg/dL (ref 0.0–1.2)
CO2: 23 mmol/L (ref 20–29)
Calcium: 9.1 mg/dL (ref 8.7–10.2)
Chloride: 101 mmol/L (ref 96–106)
Creatinine, Ser: 0.95 mg/dL (ref 0.76–1.27)
Globulin, Total: 3.1 g/dL (ref 1.5–4.5)
Glucose: 137 mg/dL — ABNORMAL HIGH (ref 70–99)
Potassium: 4.6 mmol/L (ref 3.5–5.2)
Sodium: 139 mmol/L (ref 134–144)
Total Protein: 7.4 g/dL (ref 6.0–8.5)
eGFR: 98 mL/min/{1.73_m2} (ref >59)

## 2022-12-04 LAB — BAYER DCA HB A1C WAIVED: HB A1C (BAYER DCA - WAIVED): 6.6 % — ABNORMAL HIGH (ref 4.8–5.6)

## 2022-12-04 LAB — HEPATITIS C ANTIBODY: Hep C Virus Ab: NONREACTIVE

## 2022-12-04 LAB — TSH: TSH: 1.16 u[IU]/mL (ref 0.450–4.500)

## 2022-12-05 ENCOUNTER — Encounter: Payer: Self-pay | Admitting: Family Medicine

## 2022-12-06 ENCOUNTER — Other Ambulatory Visit: Payer: Self-pay | Admitting: Family

## 2022-12-23 ENCOUNTER — Other Ambulatory Visit: Payer: Self-pay | Admitting: Family Medicine

## 2022-12-23 DIAGNOSIS — E1165 Type 2 diabetes mellitus with hyperglycemia: Secondary | ICD-10-CM

## 2022-12-25 ENCOUNTER — Encounter: Payer: Self-pay | Admitting: Family

## 2022-12-25 ENCOUNTER — Ambulatory Visit: Payer: BC Managed Care – PPO | Admitting: Family

## 2022-12-25 VITALS — BP 124/80 | HR 65 | Temp 98.3°F | Ht 70.0 in | Wt 272.2 lb

## 2022-12-25 DIAGNOSIS — R6884 Jaw pain: Secondary | ICD-10-CM

## 2022-12-25 MED ORDER — TRAMADOL HCL 50 MG PO TABS
50.0000 mg | ORAL_TABLET | Freq: Three times a day (TID) | ORAL | 0 refills | Status: AC | PRN
Start: 2022-12-25 — End: 2022-12-30

## 2022-12-25 NOTE — Progress Notes (Signed)
Subjective:    Patient ID: Jimmy Tyler, male    DOB: Nov 09, 1975, 47 y.o.   MRN: [id number]  Chief Complaint  Patient presents with   Pain    HPI Pt presents to the office today with jaw pain. He had dental extraction about a month ago, however, two weeks ago he was eating and bite down on something and noticed some pain and cracking noise in left lower gum. He went back to his dentist and was told he had some inflammation. He was told to take NSAID's. This has helped slightly but continues to have jaw pain 8 out 10 without his NSAID's and after eating. With the medications his pain is   4 out 10.   He is taking aleve, ibuprofen, and tylenol multiple times a day.   States at night his pain is worse because he wears a CPAP machine and the strap across his jaw.   Review of Systems  All other systems reviewed and are negative.      Objective:   Physical Exam Vitals reviewed.  Constitutional:      General: He is not in acute distress.    Appearance: He is well-developed. He is obese.  HENT:     Head: Normocephalic.     Comments: Full ROM of jaw, tenderness of left lower gum Eyes:     General:        Right eye: No discharge.        Left eye: No discharge.     Pupils: Pupils are equal, round, and reactive to light.  Neck:     Thyroid: No thyromegaly.  Cardiovascular:     Rate and Rhythm: Normal rate and regular rhythm.     Heart sounds: Normal heart sounds. No murmur heard. Pulmonary:     Effort: Pulmonary effort is normal. No respiratory distress.     Breath sounds: Normal breath sounds. No wheezing.  Abdominal:     General: Bowel sounds are normal. There is no distension.     Palpations: Abdomen is soft.     Tenderness: There is no abdominal tenderness.  Musculoskeletal:        General: No tenderness. Normal range of motion.     Cervical back: Normal range of motion and neck supple.  Skin:    General: Skin is warm and dry.     Findings: No erythema or rash.   Neurological:     Mental Status: He is alert and oriented to person, place, and time.     Cranial Nerves: No cranial nerve deficit.     Deep Tendon Reflexes: Reflexes are normal and symmetric.  Psychiatric:        Behavior: Behavior normal.        Thought Content: Thought content normal.        Judgment: Judgment normal.     BP 124/80   Pulse 65   Temp 98.3 F (36.8 C) (Temporal)   Ht 5\' 10"  (1.778 m)   Wt 272 lb 3.2 oz (123.5 kg)   SpO2 97%   BMI 39.06 kg/m        Assessment & Plan:   Jimmy Tyler comes in today with chief complaint of Pain   Diagnosis and orders addressed:  1. Jaw pain Continue ibuprofen, naproxen, and tylenol  Will given Ultram as needed  Pt reviewed in [place] controlled database, no red flags noted Keep follow up with dentist  Soft diet and avoid food that require a lot  of chewing Follow up as needed  - traMADol (ULTRAM) 50 MG tablet; Take 1-2 tablets (50-100 mg total) by mouth every 8 (eight) hours as needed for up to 5 days.  Dispense: 20 tablet; Refill: 0   Jannifer Rodney, FNP

## 2022-12-25 NOTE — Patient Instructions (Signed)
Dental Pain Dental pain is often a sign that something is wrong with your teeth or gums. It is also something that can occur following dental treatment. If you have dental pain, it is important to contact your dental care provider, especially if the cause of the pain has not been determined. Dental pain may be of varying intensity and can be caused by many things, including: Tooth decay (cavities or caries). Cavities are caused by bacteria that produce acids that irritate the nerve of your tooth, making it sensitive to air and hot or cold temperatures. This eventually causes discomfort or pain. Abscess or infection. Once the bacteria reach the inner part of the tooth (pulp), a bacterial infection (dental abscess) can occur. Pus typically collects at the end of the root of a tooth. Injury. A crack in the tooth. Gum recession exposing the root, and possibly the nerves, of a tooth. Gum (periodontal)disease. Abnormal grinding or clenching. Poor or improper home care. An unknown reason (idiopathic). Your pain may be mild or severe. It may occur when you are: Chewing. Exposed to hot or cold temperatures. Eating or drinking sugary foods or beverages, such as soda or candy. Your pain may be constant, or it may come and go without cause. Follow these instructions at home: The following actions may help to lessen any discomfort that you are feeling before or after getting dental care. Medicines Take over-the-counter and prescription medicines only as told by your dental care provider. If you were prescribed an antibiotic medicine, take it as told by your dental care provider. Do not stop taking the antibiotic even if you start to feel better. Eating and drinking Avoid foods or drinks that cause you pain, such as: Very hot or very cold foods or drinks. Sweet or sugary foods or drinks. Managing pain and swelling  Ice can sometimes be used to reduce pain and swelling, especially if the pain is  following dental treatment. If directed, put ice on the painful area of your face. To do this: Put ice in a plastic bag. Place a towel between your skin and the bag. Leave the ice on for 20 minutes, 2-3 times a day. Remove the ice if your skin turns bright red. This is very important. If you cannot feel pain, heat, or cold, you have a greater risk of damage to the area. Brushing your teeth To keep your mouth and gums healthy, brush your teeth twice a day using a fluoride toothpaste. Use a toothpaste made for sensitive teeth as directed by your dental care provider, especially if the root is exposed. Always brush your teeth with a soft-bristled toothbrush. This will help prevent irritation to your gums. General instructions Floss at least once a day. Do not apply heat to the outside of the face. Gargle with a mixture of salt and water 3-4 times a day or as needed. To make salt water, completely dissolve -1 tsp (3-6 g) of salt in 1 cup (237 mL) of warm water. Keep all follow-up visits. This is important. Contact a dental care provider if: You have any unexplained dental pain. Your pain is not controlled with medicines. Your symptoms get worse. You have new symptoms. Get help right away if: You are unable to open your mouth. You are having trouble breathing or swallowing. You have a fever. You notice that your face, neck, or jaw is swollen. These symptoms may represent a serious problem that is an emergency. Do not wait to see if the symptoms will go  away. Get medical help right away. Call your local emergency services (911 in the [place] Do not drive yourself to the hospital. Summary Dental pain may be caused by many things, including tooth decay and infection. Your pain may be mild or severe. Take over-the-counter and prescription medicines only as told by your dental care provider. Watch your dental pain for any changes. Let your dental care provider know if your symptoms get  worse. This information is not intended to replace advice given to you by your health care provider. Make sure you discuss any questions you have with your health care provider. Document Revised: 02/10/2020 Document Reviewed: 02/10/2020 Elsevier Patient Education  2024 [employer].

## 2023-01-07 ENCOUNTER — Telehealth: Payer: Self-pay

## 2023-01-07 ENCOUNTER — Telehealth: Payer: Self-pay | Admitting: Family

## 2023-01-07 DIAGNOSIS — R6884 Jaw pain: Secondary | ICD-10-CM

## 2023-01-07 MED ORDER — TRAMADOL HCL 50 MG PO TABS
50.0000 mg | ORAL_TABLET | Freq: Three times a day (TID) | ORAL | 0 refills | Status: AC | PRN
Start: 2023-01-07 — End: 2023-01-14

## 2023-01-07 NOTE — Telephone Encounter (Signed)
Fax sent to the plan Your PA has been faxed to the plan as a paper copy. Please contact the plan directly if you haven't received a determination in a typical timeframe.  You will be notified of the determination via fax. How do I know if the plan approved the PA?  Add Reminder to your Dashboard Remind me in:  5 business days Contact plan to follow up on [id number]

## 2023-01-07 NOTE — Telephone Encounter (Signed)
This was sent during visit, unsure why he never received mediations. At this time his pain may be resolved. I have resent.   Jannifer Rodney, FNP

## 2023-01-08 ENCOUNTER — Other Ambulatory Visit (HOSPITAL_COMMUNITY): Payer: Self-pay

## 2023-01-08 NOTE — Telephone Encounter (Signed)
Pharmacy Patient Advocate Encounter  Received notification from [facility] that Prior Authorization for Tramadol 50MG  has been CANCELLED by the processor we sent it to: Resubmitted through Prom[employer] PA to [facility]   PA #/Case ID/Reference #: [id number]    [email or web address]

## 2023-01-09 ENCOUNTER — Other Ambulatory Visit (HOSPITAL_COMMUNITY): Payer: Self-pay

## 2023-01-09 NOTE — Telephone Encounter (Signed)
Pharmacy Patient Advocate Encounter  Received notification from [employer] that Prior Authorization for Tramadol 50MG  has been APPROVED from 01/08/23 to 02/08/23. Ran test claim, Copay is $0.56. This test claim was processed through [facility]- copay amounts may vary at other pharmacies due to pharmacy/plan contracts, or as the patient moves through the different stages of their insurance plan.   PA #/Case ID/Reference #: [id number]

## 2023-01-12 ENCOUNTER — Other Ambulatory Visit: Payer: Self-pay | Admitting: Family Medicine

## 2023-01-12 DIAGNOSIS — J302 Other seasonal allergic rhinitis: Secondary | ICD-10-CM

## 2023-01-19 ENCOUNTER — Other Ambulatory Visit: Payer: Self-pay | Admitting: Family

## 2023-01-19 DIAGNOSIS — E1165 Type 2 diabetes mellitus with hyperglycemia: Secondary | ICD-10-CM

## 2023-02-12 ENCOUNTER — Telehealth: Payer: Self-pay | Admitting: Family

## 2023-02-13 ENCOUNTER — Other Ambulatory Visit: Payer: Self-pay | Admitting: Family

## 2023-02-13 DIAGNOSIS — E1165 Type 2 diabetes mellitus with hyperglycemia: Secondary | ICD-10-CM

## 2023-02-13 NOTE — Telephone Encounter (Signed)
Left message for pt to call back to schedule appt. Per last check up notes from provider, pt needs to be seen again around 04/05/23.

## 2023-02-13 NOTE — Telephone Encounter (Signed)
Hawks Needs to be seen in Oct for 3 mos FU RF sent to pharmacy

## 2023-02-21 ENCOUNTER — Other Ambulatory Visit: Payer: BC Managed Care – PPO

## 2023-02-21 DIAGNOSIS — R5383 Other fatigue: Secondary | ICD-10-CM

## 2023-02-21 DIAGNOSIS — Z0000 Encounter for general adult medical examination without abnormal findings: Secondary | ICD-10-CM

## 2023-02-24 LAB — TESTOSTERONE,FREE AND TOTAL
Testosterone, Free: 4.4 pg/mL — ABNORMAL LOW (ref 6.8–21.5)
Testosterone: 365 ng/dL (ref 264–916)

## 2023-03-11 ENCOUNTER — Other Ambulatory Visit: Payer: Self-pay | Admitting: Family Medicine

## 2023-03-11 DIAGNOSIS — E1165 Type 2 diabetes mellitus with hyperglycemia: Secondary | ICD-10-CM

## 2023-03-11 DIAGNOSIS — E782 Mixed hyperlipidemia: Secondary | ICD-10-CM

## 2023-03-11 DIAGNOSIS — I201 Angina pectoris with documented spasm: Secondary | ICD-10-CM

## 2023-03-11 DIAGNOSIS — I152 Hypertension secondary to endocrine disorders: Secondary | ICD-10-CM

## 2023-03-26 ENCOUNTER — Ambulatory Visit: Payer: BC Managed Care – PPO | Admitting: Family

## 2023-04-08 ENCOUNTER — Ambulatory Visit (INDEPENDENT_AMBULATORY_CARE_PROVIDER_SITE_OTHER): Payer: BC Managed Care – PPO

## 2023-04-08 ENCOUNTER — Encounter: Payer: Self-pay | Admitting: Family

## 2023-04-08 ENCOUNTER — Ambulatory Visit: Payer: BC Managed Care – PPO | Admitting: Family

## 2023-04-08 VITALS — BP 118/71 | HR 60 | Temp 97.6°F | Ht 70.0 in | Wt 272.0 lb

## 2023-04-08 DIAGNOSIS — E559 Vitamin D deficiency, unspecified: Secondary | ICD-10-CM

## 2023-04-08 DIAGNOSIS — T466X5D Adverse effect of antihyperlipidemic and antiarteriosclerotic drugs, subsequent encounter: Secondary | ICD-10-CM

## 2023-04-08 DIAGNOSIS — G720 Drug-induced myopathy: Secondary | ICD-10-CM

## 2023-04-08 DIAGNOSIS — R5383 Other fatigue: Secondary | ICD-10-CM

## 2023-04-08 DIAGNOSIS — E1165 Type 2 diabetes mellitus with hyperglycemia: Secondary | ICD-10-CM

## 2023-04-08 DIAGNOSIS — E1169 Type 2 diabetes mellitus with other specified complication: Secondary | ICD-10-CM

## 2023-04-08 DIAGNOSIS — E1159 Type 2 diabetes mellitus with other circulatory complications: Secondary | ICD-10-CM

## 2023-04-08 DIAGNOSIS — G4733 Obstructive sleep apnea (adult) (pediatric): Secondary | ICD-10-CM

## 2023-04-08 DIAGNOSIS — I201 Angina pectoris with documented spasm: Secondary | ICD-10-CM | POA: Diagnosis not present

## 2023-04-08 DIAGNOSIS — E782 Mixed hyperlipidemia: Secondary | ICD-10-CM

## 2023-04-08 DIAGNOSIS — K219 Gastro-esophageal reflux disease without esophagitis: Secondary | ICD-10-CM

## 2023-04-08 DIAGNOSIS — E349 Endocrine disorder, unspecified: Secondary | ICD-10-CM | POA: Diagnosis not present

## 2023-04-08 DIAGNOSIS — I152 Hypertension secondary to endocrine disorders: Secondary | ICD-10-CM

## 2023-04-08 DIAGNOSIS — I252 Old myocardial infarction: Secondary | ICD-10-CM

## 2023-04-08 DIAGNOSIS — E785 Hyperlipidemia, unspecified: Secondary | ICD-10-CM

## 2023-04-08 LAB — BAYER DCA HB A1C WAIVED: HB A1C (BAYER DCA - WAIVED): 6.3 % — ABNORMAL HIGH (ref 4.8–5.6)

## 2023-04-08 LAB — HM DIABETES EYE EXAM

## 2023-04-08 MED ORDER — DAPAGLIFLOZIN PROPANEDIOL 10 MG PO TABS: 10.0000 mg | ORAL_TABLET | Freq: Every day | ORAL | 1 refills | Status: DC

## 2023-04-08 MED ORDER — LISINOPRIL 10 MG PO TABS
10.0000 mg | ORAL_TABLET | Freq: Every day | ORAL | 1 refills | Status: DC
Start: 2023-04-08 — End: 2023-11-26

## 2023-04-08 MED ORDER — TIRZEPATIDE 10 MG/0.5ML ~~LOC~~ SOAJ: 10.0000 mg | SUBCUTANEOUS | 2 refills | Status: DC

## 2023-04-08 MED ORDER — AMLODIPINE BESYLATE 10 MG PO TABS
10.0000 mg | ORAL_TABLET | Freq: Every day | ORAL | 0 refills | Status: DC
Start: 2023-04-08 — End: 2023-10-03

## 2023-04-08 MED ORDER — METOPROLOL TARTRATE 25 MG PO TABS: 25.0000 mg | ORAL_TABLET | Freq: Two times a day (BID) | ORAL | 1 refills | Status: DC

## 2023-04-08 MED ORDER — NEXLIZET 180-10 MG PO TABS: 1.0000 | ORAL_TABLET | Freq: Every day | ORAL | 1 refills | Status: DC

## 2023-04-08 NOTE — Progress Notes (Signed)
Subjective:    Patient ID: Jimmy Tyler, male    DOB: [birth year], 47 y.o.   MRN: [id number]  Chief Complaint  Patient presents with   Medical Management of Chronic Issues   Pt presents to the office today for chronic follow up.    Has hx of MI. Is not followed by Cardiologists.    Pt states he can not tolerate statins because of myalgia.     Has OSA and uses CPAP nightly.    He is morbid obese with BMI of 39 with DM and HTN.    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      04/08/2023    3:24 PM 12/25/2022    4:09 PM 12/03/2022    3:47 PM  Last 3 Weights  Weight (lbs) 272 lb 272 lb 3.2 oz 272 lb  Weight (kg) 123.378 kg 123.469 kg 123.378 kg    He is complaining of fatigue. He had low free testosterone, but a normal testosterone.   Hypertension This is a chronic problem. The current episode started more than 1 year ago. The problem has been resolved since onset. The problem is controlled. Associated symptoms include malaise/fatigue and peripheral edema. Pertinent negatives include no blurred vision or shortness of breath. Risk factors for coronary artery disease include dyslipidemia, diabetes mellitus, obesity, male gender and sedentary lifestyle. The current treatment provides moderate improvement.  Diabetes He presents for his follow-up diabetic visit. He has type 2 diabetes mellitus. Pertinent negatives for diabetes include no blurred vision and no foot paresthesias. Symptoms are stable. Pertinent negatives for diabetic complications include no peripheral neuropathy. Risk factors for coronary artery disease include dyslipidemia, diabetes mellitus, hypertension and sedentary lifestyle. He is following a generally healthy diet. (Does check glucose at home)  Gastroesophageal Reflux He complains of belching and heartburn. This is a chronic problem. The current episode started more than 1 year ago. The problem occurs occasionally. Risk factors  include obesity. The treatment provided moderate relief.  Hyperlipidemia This is a chronic problem. The current episode started more than 1 year ago. The problem is controlled. Exacerbating diseases include obesity. Pertinent negatives include no shortness of breath. Current antihyperlipidemic treatment includes ezetimibe. The current treatment provides moderate improvement of lipids.      Review of Systems  Constitutional:  Positive for malaise/fatigue.  Eyes:  Negative for blurred vision.  Respiratory:  Negative for shortness of breath.   Gastrointestinal:  Positive for heartburn.  All other systems reviewed and are negative.      Objective:   Physical Exam Vitals reviewed.  Constitutional:      General: He is not in acute distress.    Appearance: He is well-developed. He is obese.  HENT:     Head: Normocephalic.     Right Ear: Tympanic membrane normal.     Left Ear: Tympanic membrane normal.  Eyes:     General:        Right eye: No discharge.        Left eye: No discharge.     Pupils: Pupils are equal, round, and reactive to light.  Neck:     Thyroid: No thyromegaly.  Cardiovascular:     Rate and Rhythm: Normal rate and regular rhythm.     Heart sounds: Normal heart sounds. No murmur heard. Pulmonary:     Effort: Pulmonary effort is normal. No respiratory distress.     Breath sounds: Normal breath sounds. No wheezing.  Abdominal:     General: Bowel sounds are normal. There is no distension.     Palpations: Abdomen is soft.     Tenderness: There is no abdominal tenderness.  Musculoskeletal:        General: No tenderness. Normal range of motion.     Cervical back: Normal range of motion and neck supple.  Skin:    General: Skin is warm and dry.     Findings: No erythema or rash.  Neurological:     Mental Status: He is alert and oriented to person, place, and time.     Cranial Nerves: No cranial nerve deficit.     Deep Tendon Reflexes: Reflexes are normal and  symmetric.  Psychiatric:        Behavior: Behavior normal.        Thought Content: Thought content normal.        Judgment: Judgment normal.      BP 118/71   Pulse 60   Temp 97.6 F (36.4 C) (Temporal)   Ht 5\' 10"  (1.778 m)   Wt 272 lb (123.4 kg)   SpO2 97%   BMI 39.03 kg/m       Assessment & Plan:  Jimmy Tyler comes in today with chief complaint of Medical Management of Chronic Issues   Diagnosis and orders addressed:  1. Hypertension associated with type 2 diabetes mellitus (HCC) - amLODipine (NORVASC) 10 MG tablet; Take 1 tablet (10 mg total) by mouth daily.  Dispense: 90 tablet; Refill: 0 - lisinopril (ZESTRIL) 10 MG tablet; Take 1 tablet (10 mg total) by mouth daily.  Dispense: 90 tablet; Refill: 1 - metoprolol tartrate (LOPRESSOR) 25 MG tablet; Take 1 tablet (25 mg total) by mouth 2 (two) times daily.  Dispense: 180 tablet; Refill: 1 - CMP14+EGFR  2. Coronary vasospasm (HCC) - amLODipine (NORVASC) 10 MG tablet; Take 1 tablet (10 mg total) by mouth daily.  Dispense: 90 tablet; Refill: 0 - CMP14+EGFR  3. Type 2 diabetes mellitus with hyperglycemia, without long-term current use of insulin (HCC) - dapagliflozin propanediol (FARXIGA) 10 MG TABS tablet; Take 1 tablet (10 mg total) by mouth daily.  Dispense: 90 tablet; Refill: 1 - lisinopril (ZESTRIL) 10 MG tablet; Take 1 tablet (10 mg total) by mouth daily.  Dispense: 90 tablet; Refill: 1 - Bayer DCA Hb A1c Waived - CMP14+EGFR  4. Mixed hyperlipidemia - Bempedoic Acid-Ezetimibe (NEXLIZET) 180-10 MG TABS; Take 1 tablet by mouth daily.  Dispense: 90 tablet; Refill: 1 - CMP14+EGFR  5. Other fatigue - Ambulatory referral to Endocrinology - CMP14+EGFR  6. Testosterone deficiency - Ambulatory referral to Endocrinology - CMP14+EGFR  7. Hyperlipidemia associated with type 2 diabetes mellitus (HCC) - CMP14+EGFR  8. Morbid obesity (HCC) - CMP14+EGFR  9. History of myocardial infarction - CMP14+EGFR  10. OSA  (obstructive sleep apnea) - CMP14+EGFR  11. Vitamin D deficiency - CMP14+EGFR  12. Gastroesophageal reflux disease without esophagitis - CMP14+EGFR  13. Statin myopathy - CMP14+EGFR   Labs pending Continue current medication  Health Maintenance reviewed- Diabetic eye exam pending  Diet and exercise encouraged  Follow up plan: 3 months    Jannifer Rodney, FNP

## 2023-04-08 NOTE — Progress Notes (Unsigned)
Jimmy Tyler arrived 04/08/2023 and has given verbal consent to obtain images and complete their overdue diabetic retinal screening.  The images have been sent to an ophthalmologist or optometrist for review and interpretation.  Results will be sent back to Jimmy Spencer, FNP for review.  Patient has been informed they will be contacted when we receive the results via telephone or MyChart

## 2023-04-08 NOTE — Patient Instructions (Signed)
Fatigue If you have fatigue, you feel tired all the time and have a lack of energy or a lack of motivation. Fatigue may make it difficult to start or complete tasks because of exhaustion. Occasional or mild fatigue is often a normal response to activity or life. However, long-term (chronic) or extreme fatigue may be a symptom of a medical condition such as: Depression. Not having enough red blood cells or hemoglobin in the blood (anemia). A problem with a small gland located in the lower front part of the neck (thyroid disorder). Rheumatologic conditions. These are problems related to the body's defense system (immune system). Infections, especially certain viral infections. Fatigue can also lead to negative health outcomes over time. Follow these instructions at home: Medicines Take over-the-counter and prescription medicines only as told by your health care provider. Take a multivitamin if told by your health care provider. Do not use herbal or dietary supplements unless they are approved by your health care provider. Eating and drinking  Avoid heavy meals in the evening. Eat a well-balanced diet, which includes lean proteins, whole grains, plenty of fruits and vegetables, and low-fat dairy products. Avoid eating or drinking too many products with caffeine in them. Avoid alcohol. Drink enough fluid to keep your urine pale yellow. Activity  Exercise regularly, as told by your health care provider. Use or practice techniques to help you relax, such as yoga, tai chi, meditation, or massage therapy. Lifestyle Change situations that cause you stress. Try to keep your work and personal schedules in balance. Do not use recreational or illegal drugs. General instructions Monitor your fatigue for any changes. Go to bed and get up at the same time every day. Avoid fatigue by pacing yourself during the day and getting enough sleep at night. Maintain a healthy weight. Contact a health care  provider if: Your fatigue does not get better. You have a fever. You suddenly lose or gain weight. You have headaches. You have trouble falling asleep or sleeping through the night. You feel angry, guilty, anxious, or sad. You have swelling in your legs or another part of your body. Get help right away if: You feel confused, feel like you might faint, or faint. Your vision is blurry or you have a severe headache. You have severe pain in your abdomen, your back, or the area between your waist and hips (pelvis). You have chest pain, shortness of breath, or an irregular or fast heartbeat. You are unable to urinate, or you urinate less than normal. You have abnormal bleeding from the rectum, nose, lungs, nipples, or, if you are male, the vagina. You vomit blood. You have thoughts about hurting yourself or others. These symptoms may be an emergency. Get help right away. Call 911. Do not wait to see if the symptoms will go away. Do not drive yourself to the hospital. Get help right away if you feel like you may hurt yourself or others, or have thoughts about taking your own life. Go to your nearest emergency room or: Call 911. Call the National Suicide Prevention Lifeline at 1-800-273-8255 or 988. This is open 24 hours a day. Text the Crisis Text Line at 741741. Summary If you have fatigue, you feel tired all the time and have a lack of energy or a lack of motivation. Fatigue may make it difficult to start or complete tasks because of exhaustion. Long-term (chronic) or extreme fatigue may be a symptom of a medical condition. Exercise regularly, as told by your health care provider.   Change situations that cause you stress. Try to keep your work and personal schedules in balance. This information is not intended to replace advice given to you by your health care provider. Make sure you discuss any questions you have with your health care provider. Document Revised: 02/27/2021 Document  Reviewed: 02/27/2021 Elsevier Patient Education  2024 Elsevier Inc.  

## 2023-04-09 LAB — CMP14+EGFR
ALT: 46 [IU]/L — ABNORMAL HIGH (ref 0–44)
AST: 42 [IU]/L — ABNORMAL HIGH (ref 0–40)
Albumin: 4.4 g/dL (ref 4.1–5.1)
Alkaline Phosphatase: 61 [IU]/L (ref 44–121)
BUN/Creatinine Ratio: 19 (ref 9–20)
BUN: 18 mg/dL (ref 6–24)
Bilirubin Total: 0.4 mg/dL (ref 0.0–1.2)
CO2: 24 mmol/L (ref 20–29)
Calcium: 9.4 mg/dL (ref 8.7–10.2)
Chloride: 102 mmol/L (ref 96–106)
Creatinine, Ser: 0.94 mg/dL (ref 0.76–1.27)
Globulin, Total: 3.1 g/dL (ref 1.5–4.5)
Glucose: 132 mg/dL — ABNORMAL HIGH (ref 70–99)
Potassium: 4.3 mmol/L (ref 3.5–5.2)
Sodium: 140 mmol/L (ref 134–144)
Total Protein: 7.5 g/dL (ref 6.0–8.5)
eGFR: 99 mL/min/{1.73_m2} (ref >59)

## 2023-04-23 NOTE — Progress Notes (Signed)
Jimmy Tyler arrived 04/23/2023 and has given verbal consent to obtain images and complete their overdue diabetic retinal screening.  The images have been sent to an ophthalmologist or optometrist for review and interpretation.  Results will be sent back to [name], FNP for review.  Patient has been informed they will be contacted when we receive the results via telephone or MyChart

## 2023-05-07 ENCOUNTER — Encounter: Payer: Self-pay | Admitting: Family Medicine

## 2023-05-07 ENCOUNTER — Ambulatory Visit: Payer: BC Managed Care – PPO | Admitting: Family Medicine

## 2023-05-07 VITALS — BP 121/64 | HR 62 | Temp 98.3°F | Ht 70.0 in | Wt 270.6 lb

## 2023-05-07 DIAGNOSIS — L929 Granulomatous disorder of the skin and subcutaneous tissue, unspecified: Secondary | ICD-10-CM | POA: Diagnosis not present

## 2023-05-07 NOTE — Progress Notes (Signed)
Chief Complaint  Patient presents with   Cyst    HPI  Patient presents today for lesion on left side of neck. Shaved it off, just came back bigger. Concerned for Ca.   PMH: Smoking status noted ROS: Per HPI  Objective: BP 121/64   Pulse 62   Temp 98.3 F (36.8 C)   Ht 5\' 10"  (1.778 m)   Wt 270 lb 9.6 oz (122.7 kg)   SpO2 97%   BMI 38.83 kg/m  Gen: NAD, alert, cooperative with exam HEENT: NCAT, EOMI, PERRL Skin:6 mm granuloma at mid neck laterally to the left. Inflamed, dried blood at surface Neuro: Alert and oriented, No gross deficits  Assessment and plan:  1. Granuloma of skin     Frozen with liquis nitrogen   Wound care reviewed.  Follow up as needed.  Mechele Claude, MD

## 2023-06-07 ENCOUNTER — Telehealth: Payer: Self-pay

## 2023-06-07 ENCOUNTER — Other Ambulatory Visit (HOSPITAL_COMMUNITY): Payer: Self-pay

## 2023-06-07 ENCOUNTER — Telehealth: Payer: Self-pay | Admitting: Family Medicine

## 2023-06-07 NOTE — Telephone Encounter (Unsigned)
Copied from CRM [id number]. Topic: Clinical - Prescription Issue >> Jun 07, 2023 12:14 PM Halina Maidens L wrote: Reason for CRM: patient need prio auth for tirzepatide [facility]) 10 MG/0.5ML Pen

## 2023-06-07 NOTE — Telephone Encounter (Signed)
Pharmacy Patient Advocate Encounter   Received notification from Pt Calls Messages that prior authorization for [facility] 10MG  is required/requested.   Insurance verification completed.   The patient is insured through [facility] .   Per test claim: PA required; PA submitted to above mentioned insurance via Prompt PA Key/confirmation #/[id number] Status is pending  [email or web address]

## 2023-06-07 NOTE — Telephone Encounter (Signed)
PA request has been Submitted. New Encounter created for follow up. For additional info see Pharmacy Prior Auth telephone encounter from 06/07/23.

## 2023-06-12 ENCOUNTER — Other Ambulatory Visit (HOSPITAL_COMMUNITY): Payer: Self-pay

## 2023-06-12 NOTE — Telephone Encounter (Signed)
Pharmacy Patient Advocate Encounter  Received notification from [employer] that Prior Authorization for [facility] 10MG   has been APPROVED from 06/09/23 to 06/07/24. Ran test claim, Copay is $0. This test claim was processed through [facility]- copay amounts may vary at other pharmacies due to pharmacy/plan contracts, or as the patient moves through the different stages of their insurance plan.   PA #/Case ID/Reference #: [id number]

## 2023-06-13 ENCOUNTER — Other Ambulatory Visit: Payer: Self-pay | Admitting: Family

## 2023-06-13 MED ORDER — TIRZEPATIDE 10 MG/0.5ML ~~LOC~~ SOAJ: 10.0000 mg | SUBCUTANEOUS | 2 refills | Status: DC

## 2023-06-13 NOTE — Telephone Encounter (Signed)
Copied from CRM [id number]. Topic: Clinical - Medication Refill >> Jun 13, 2023  4:37 PM Adelina Mings wrote: Most Recent Primary Care Visit:  Provider: Mechele Claude  Department: [facility]  Visit Type: ACUTE  Date: 05/07/2023  Medication: tirzepatide [facility]) 10 MG/0.5ML Pen   Has the patient contacted their pharmacy? No (Agent: If no, request that the patient contact the pharmacy for the refill. If patient does not wish to contact the pharmacy document the reason why and proceed with request.) (Agent: If yes, when and what did the pharmacy advise?)  Is this the correct pharmacy for this prescription? Yes If no, delete pharmacy and type the correct one.  This is the patient's preferred pharmacy:  [facility] [address], [place] - [address] [place] [address] [place] [address] Phone: [phone] Fax: [phone]   Has the prescription been filled recently? No  Is the patient out of the medication? Yes  Has the patient been seen for an appointment in the last year OR does the patient have an upcoming appointment? Yes  Can we respond through MyChart? Yes  Agent: Please be advised that Rx refills may take up to 3 business days. We ask that you follow-up with your pharmacy.

## 2023-06-13 NOTE — Telephone Encounter (Signed)
Last Fill: 04/08/23  Last OV: 05/17/23 Next OV: 07/15/23  Routing to provider for review/authorization.

## 2023-06-25 ENCOUNTER — Encounter (INDEPENDENT_AMBULATORY_CARE_PROVIDER_SITE_OTHER): Payer: Self-pay | Admitting: *Deleted

## 2023-07-11 ENCOUNTER — Encounter: Payer: Self-pay | Admitting: "Endocrinology

## 2023-07-11 ENCOUNTER — Ambulatory Visit: Payer: BC Managed Care – PPO | Admitting: "Endocrinology

## 2023-07-11 VITALS — BP 120/66 | HR 72 | Ht 70.0 in | Wt 274.0 lb

## 2023-07-11 DIAGNOSIS — E1165 Type 2 diabetes mellitus with hyperglycemia: Secondary | ICD-10-CM

## 2023-07-11 DIAGNOSIS — Z6839 Body mass index (BMI) 39.0-39.9, adult: Secondary | ICD-10-CM

## 2023-07-11 DIAGNOSIS — E66812 Obesity, class 2: Secondary | ICD-10-CM | POA: Insufficient documentation

## 2023-07-11 DIAGNOSIS — E291 Testicular hypofunction: Secondary | ICD-10-CM

## 2023-07-11 DIAGNOSIS — E782 Mixed hyperlipidemia: Secondary | ICD-10-CM

## 2023-07-11 DIAGNOSIS — Z7985 Long-term (current) use of injectable non-insulin antidiabetic drugs: Secondary | ICD-10-CM

## 2023-07-11 DIAGNOSIS — Z7984 Long term (current) use of oral hypoglycemic drugs: Secondary | ICD-10-CM

## 2023-07-11 NOTE — Progress Notes (Signed)
Endocrinology Consult Note                                            07/11/2023, 3:28 PM   Subjective:    Patient ID: Jimmy Tyler, male    DOB: [birth year], PCP [name], FNP   Past Medical History:  Diagnosis Date   Allergy    Asthma    Diabetes mellitus without complication (HCC)    GERD (gastroesophageal reflux disease)    History of Clostridium difficile infection    Hyperlipidemia    Hypertension    Myocardial infarction (HCC) 2014   OSA on CPAP    Testicular hypofunction    Umbilical hernia    Past Surgical History:  Procedure Laterality Date   BARIATRIC SURGERY  07/13/2013   BIOPSY  07/19/2020   Procedure: BIOPSY;  Surgeon: Lanelle Bal, DO;  [facility];  Service: Endoscopy;;   COLONOSCOPY WITH PROPOFOL N/A 07/19/2020   Procedure: COLONOSCOPY WITH PROPOFOL;  Surgeon: Lanelle Bal, DO;  [facility];  Service: Endoscopy;  Laterality: N/A;  early am appt   ESOPHAGOGASTRODUODENOSCOPY (EGD) WITH PROPOFOL N/A 07/19/2020   Procedure: ESOPHAGOGASTRODUODENOSCOPY (EGD) WITH PROPOFOL;  Surgeon: Lanelle Bal, DO;  [facility];  Service: Endoscopy;  Laterality: N/A;   HIATAL HERNIA REPAIR     KNEE SURGERY Left 2015   Due to meniscal tear   LEFT HEART CATH AND CORONARY ANGIOGRAPHY N/A 07/28/2020   Procedure: LEFT HEART CATH AND CORONARY ANGIOGRAPHY;  Surgeon: Marykay Lex, MD;  Location: [facility] INVASIVE CV LAB;  Service: Cardiovascular;  Laterality: N/A;   NASAL SEPTUM SURGERY  03/2019   POLYPECTOMY  07/19/2020   Procedure: POLYPECTOMY;  Surgeon: Lanelle Bal, DO;  Location: [facility];  Service: Endoscopy;;   Social History   Socioeconomic History   Marital status: Divorced    Spouse name: Not on file   Number of children: Not on file   Years of education: Not on file   Highest education level: Not on file  Occupational History   Not on file  Tobacco Use   Smoking status: Former    Current  packs/day: 0.00    Types: Cigarettes    Quit date: 02/04/2014    Years since quitting: 9.4   Smokeless tobacco: Never  Vaping Use   Vaping status: Never Used  Substance and Sexual Activity   Alcohol use: Yes    Comment: very rarely    Drug use: Never   Sexual activity: Yes    Birth control/protection: None  Other Topics Concern   Not on file  Social History Narrative   Not on file   Social Drivers of Health   Financial Resource Strain: Not on file  Food Insecurity: Not on file  Transportation Needs: Not on file  Physical Activity: Not on file  Stress: Not on file  Social Connections: Not on file   Family History  Problem Relation Age of Onset   COPD Mother    Autism Son    Skin cancer Maternal Grandmother    Heart attack Maternal Grandfather    Outpatient Encounter Medications as of 07/11/2023  Medication Sig   Multiple Vitamin (MULTIVITAMIN ADULT PO) Take 1 tablet by mouth daily.   amLODipine (NORVASC) 10 MG tablet Take 1 tablet (10 mg total)  by mouth daily.   Bempedoic Acid-Ezetimibe (NEXLIZET) 180-10 MG TABS Take 1 tablet by mouth daily.   cholecalciferol (VITAMIN D3) 25 MCG (1000 UNIT) tablet Take 1,000 Units by mouth daily. (Patient not taking: Reported on 05/07/2023)   dapagliflozin propanediol (FARXIGA) 10 MG TABS tablet Take 1 tablet (10 mg total) by mouth daily.   Famotidine (PEPCID PO) Take 20 mg by mouth at bedtime.   fluticasone (FLONASE) 50 MCG/ACT nasal spray Place 2 sprays into both nostrils daily.   levocetirizine (XYZAL) 5 MG tablet TAKE 1 TABLET BY MOUTH ONCE DAILY IN THE EVENING   lisinopril (ZESTRIL) 10 MG tablet Take 1 tablet (10 mg total) by mouth daily.   loperamide (IMODIUM) 2 MG capsule Take 6-8 mg by mouth daily as needed for diarrhea or loose stools.   metoprolol tartrate (LOPRESSOR) 25 MG tablet Take 1 tablet (25 mg total) by mouth 2 (two) times daily.   tirzepatide (MOUNJARO) 10 MG/0.5ML Pen Inject 10 mg into the skin once a week.   No  facility-administered encounter medications on file as of 07/11/2023.   ALLERGIES: Allergies  Allergen Reactions   Other Other (See Comments)    Body aches.  Failed 3 different ones.   Gramineae Pollens    Statins Other (See Comments)    Body aches.  Failed 3 different ones. Body aches.  Failed 3 different ones. Body aches.  Failed 3 different ones. Other reaction(s): Other (See Comments) Body aches.  Failed 3 different ones. Body aches.  Failed 3 different ones.    VACCINATION STATUS: Immunization History  Administered Date(s) Administered   Pneumococcal Polysaccharide-23 01/06/2013   Tdap 01/26/2015    HPI Jimmy Tyler is 48 y.o. male who presents today with a medical history as above. he is being seen in consultation for hypogonadism requested by [name], FNP.   History is obtained directly from the patient as well as chart review.  Patient reports that he was previously treated for hypogonadism 5 years ago with injectable testosterone replacement.  However he has not received any testosterone recently. His last labs were from October 2024 where his total testosterone was 365.   He denies any history of testicular injury, chemotherapy, and radiation therapy.  He denies any significant head injury.  He fathers one 42-year-old child.  He does not have any plan for more children, not worried about keeping his fertility.   He denies ED, only rare low libido.  He is a social alcohol user.  His major complaint seems to be fatigue.  He has multiple medical problems including sleep apnea, coronary artery disease, type 2 diabetes, hypertension, hyperlipidemia, obesity.  Per history, he did have coronary artery disease which required cardiac catheterization/angiography in 2022.  He denies chest pain, shortness of breath.  However, he does not exert enough.  He uses CPAP to sleep. He weighed as high as 325 pounds prior to 2024 when he underwent sleeve gastrectomy which helped him  lose weight down to 265 pounds.  More recently, he is progressively regaining.  His current medications include Mounjaro 10 mg subcutaneously weekly, bempedoic acid-ezetimibe for hypercholesterolemia, amlodipine, metoprolol and and lisinopril for hypertension management. His most recent A1c was 6.3%.  Review of Systems  Constitutional: +  fluctuating body weight status post sleeve gastrectomy in 2014 + fatigue, no subjective hyperthermia, no subjective hypothermia Eyes: no blurry vision, no xerophthalmia ENT: no sore throat, no nodules palpated in throat, no dysphagia/odynophagia, no hoarseness Cardiovascular: no Chest Pain, no Shortness of  Breath, no palpitations, no leg swelling Respiratory: no cough, no shortness of breath Gastrointestinal: no Nausea/Vomiting/Diarhhea Musculoskeletal: no muscle/joint aches Skin: no rashes Neurological: no tremors, no numbness, no tingling, no dizziness Psychiatric: no depression, no anxiety  Objective:       07/11/2023    2:34 PM 05/07/2023    4:01 PM 04/08/2023    3:24 PM  Vitals with BMI  Height 5\' 10"  5\' 10"  5\' 10"   Weight 274 lbs 270 lbs 10 oz 272 lbs  BMI 39.32 38.83 39.03  Systolic 120 121 147  Diastolic 66 64 71  Pulse 72 62 60    BP 120/66   Pulse 72   Ht 5\' 10"  (1.778 m)   Wt 274 lb (124.3 kg)   BMI 39.31 kg/m   Wt Readings from Last 3 Encounters:  07/11/23 274 lb (124.3 kg)  05/07/23 270 lb 9.6 oz (122.7 kg)  04/08/23 272 lb (123.4 kg)    Physical Exam  Constitutional:  Body mass index is 39.31 kg/m.,  not in acute distress, normal state of mind Eyes: PERRLA, EOMI, no exophthalmos ENT: moist mucous membranes, no gross thyromegaly, no gross cervical lymphadenopathy Cardiovascular: normal precordial activity, Regular Rate and Rhythm, no Murmur/Rubs/Gallops Respiratory:  adequate breathing efforts, no gross chest deformity, Clear to auscultation bilaterally Gastrointestinal: abdomen soft, Non -tender, No distension, Bowel  Sounds present, no gross organomegaly Musculoskeletal: no gross deformities, strength intact in all four extremities, no peripheral edema Skin: moist, warm, no rashes Genital Exam: Testicles are 15 cc bilaterally, no intrascrotal mass lesion.  No inguinal or femoral hernia.  Normal male external escutcheon. Neurological: no tremor with outstretched hands, Deep tendon reflexes normal in bilateral lower extremities.  CMP ( most recent) CMP     Component Value Date/Time   NA 140 04/08/2023 1554   K 4.3 04/08/2023 1554   CL 102 04/08/2023 1554   CO2 24 04/08/2023 1554   GLUCOSE 132 (H) 04/08/2023 1554   GLUCOSE 150 (H) 07/29/2020 0252   BUN 18 04/08/2023 1554   CREATININE 0.94 04/08/2023 1554   CALCIUM 9.4 04/08/2023 1554   PROT 7.5 04/08/2023 1554   ALBUMIN 4.4 04/08/2023 1554   AST 42 (H) 04/08/2023 1554   ALT 46 (H) 04/08/2023 1554   ALKPHOS 61 04/08/2023 1554   BILITOT 0.4 04/08/2023 1554   EGFR 99 04/08/2023 1554   GFRNONAA >60 07/29/2020 0252    Diabetic Labs (most recent): Lab Results  Component Value Date   HGBA1C 6.3 (H) 04/08/2023   HGBA1C 6.6 (H) 12/03/2022   HGBA1C 6.4 (H) 08/21/2022     Lipid Panel ( most recent) Lipid Panel     Component Value Date/Time   CHOL 133 08/21/2022 0840   TRIG 169 (H) 08/21/2022 0840   HDL 41 08/21/2022 0840   CHOLHDL 3.2 08/21/2022 0840   CHOLHDL 4.7 07/29/2020 0252   VLDL 51 (H) 07/29/2020 0252   LDLCALC 63 08/21/2022 0840   LABVLDL 29 08/21/2022 0840      Lab Results  Component Value Date   TSH 1.160 12/03/2022   TSH 1.850 02/08/2020           Assessment & Plan:   1. 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 - I have reviewed his available  records and clinically evaluated the patient. - Based on these reviews, he has history of hypogonadism which was treated with IM testosterone 5 years ago.  However, he does not have recent sufficient  information to proceed with definitive treatment plan.  This patient will benefit from repeat, more complete assessment with measurements of: - Prolactin - Ferritin - Luteinizing hormone - Follicle stimulating hormone - CBC with Differential/Platelet - PSA - Testosterone, Free, Total, SHBG  In light of his total testosterone at 365 in October 2024, and normal testicular exam, this patient is likely due eugonadal. He will benefit the most from management of his metabolic syndrome, sleep apnea.  He declines my offer for lifestyle medicine.  He is advised to continue his current medications to treat type 2 diabetes, hypertension, hyperlipidemia. This patient's risk for cardiovascular disease exceedingly high.  He would benefit from reassessment with at least a stress test via cardiology; hence needs referral to cardiology by his PCP.   If his  labs are suggestive of unequivocal hypogonadism, he will be considered for androgen replacement therapy as appropriate.   - I did not initiate any new prescriptions today. - he is advised to maintain close follow up with [name], FNP for primary care needs.   -Thank you for involving me in the care of this pleasant patient.  Time spent with the patient: 46  minutes, of which >50% was spent in  counseling him about his history of hypogonadism, metabolic syndrome, fatigue and the rest in obtaining information about his symptoms, reviewing his previous labs/studies ( including abstractions from other facilities),  evaluations, and treatments,  and developing a plan to confirm diagnosis and long term treatment based on the latest standards of care/guidelines; and documenting his care.  Jimmy Tyler participated in the discussions, expressed understanding, and voiced agreement with the above plans.  All questions were answered to his satisfaction. he is encouraged to contact clinic should he have any questions or concerns prior to his return  visit.  Follow up plan: Return in about 2 weeks (around 07/25/2023) for Fasting Labs  in AM B4 8.   Marquis Lunch, MD [facility] [address] [place], [place] [address] Phone: [phone]  Fax: [phone]     07/11/2023, 3:28 PM  This note was partially dictated with voice recognition software. Similar sounding words can be transcribed inadequately or may not  be corrected upon review.

## 2023-07-15 ENCOUNTER — Ambulatory Visit: Payer: BC Managed Care – PPO | Admitting: Family

## 2023-07-17 ENCOUNTER — Other Ambulatory Visit: Payer: Self-pay | Admitting: Family Medicine

## 2023-07-17 DIAGNOSIS — J302 Other seasonal allergic rhinitis: Secondary | ICD-10-CM

## 2023-07-19 LAB — TESTOSTERONE, FREE, TOTAL, SHBG
Sex Hormone Binding: 53 nmol/L (ref 16.5–55.9)
Testosterone, Free: 10.1 pg/mL (ref 6.8–21.5)
Testosterone: 638 ng/dL (ref 264–916)

## 2023-07-19 LAB — CBC WITH DIFFERENTIAL/PLATELET
Basophils Absolute: 0 10*3/uL (ref 0.0–0.2)
Basos: 1 %
EOS (ABSOLUTE): 0.1 10*3/uL (ref 0.0–0.4)
Eos: 2 %
Hematocrit: 46.9 % (ref 37.5–51.0)
Hemoglobin: 15.3 g/dL (ref 13.0–17.7)
Immature Grans (Abs): 0 10*3/uL (ref 0.0–0.1)
Immature Granulocytes: 0 %
Lymphocytes Absolute: 1.8 10*3/uL (ref 0.7–3.1)
Lymphs: 32 %
MCH: 30 pg (ref 26.6–33.0)
MCHC: 32.6 g/dL (ref 31.5–35.7)
MCV: 92 fL (ref 79–97)
Monocytes Absolute: 0.4 10*3/uL (ref 0.1–0.9)
Monocytes: 7 %
Neutrophils Absolute: 3.4 10*3/uL (ref 1.4–7.0)
Neutrophils: 58 %
Platelets: 280 10*3/uL (ref 150–450)
RBC: 5.1 x10E6/uL (ref 4.14–5.80)
RDW: 13.2 % (ref 11.6–15.4)
WBC: 5.7 10*3/uL (ref 3.4–10.8)

## 2023-07-19 LAB — FOLLICLE STIMULATING HORMONE: FSH: 4.6 m[IU]/mL (ref 1.5–12.4)

## 2023-07-19 LAB — FERRITIN: Ferritin: 148 ng/mL (ref 30–400)

## 2023-07-19 LAB — LUTEINIZING HORMONE: LH: 7.5 m[IU]/mL (ref 1.7–8.6)

## 2023-07-19 LAB — PROLACTIN: Prolactin: 6.3 ng/mL (ref 3.9–22.7)

## 2023-07-19 LAB — PSA: Prostate Specific Ag, Serum: 1.4 ng/mL (ref 0.0–4.0)

## 2023-07-29 ENCOUNTER — Ambulatory Visit: Payer: BC Managed Care – PPO | Admitting: Family

## 2023-07-31 ENCOUNTER — Encounter: Payer: Self-pay | Admitting: "Endocrinology

## 2023-07-31 ENCOUNTER — Ambulatory Visit: Payer: BC Managed Care – PPO | Admitting: "Endocrinology

## 2023-07-31 VITALS — BP 122/62 | HR 72 | Ht 70.0 in | Wt 273.2 lb

## 2023-07-31 DIAGNOSIS — E349 Endocrine disorder, unspecified: Secondary | ICD-10-CM | POA: Diagnosis not present

## 2023-07-31 NOTE — Progress Notes (Signed)
 07/31/2023, 6:57 PM  Endocrinology follow-up note   Subjective:    Patient ID: Jimmy Tyler, male    DOB: [birth year], PCP [name], FNP   Past Medical History:  Diagnosis Date   Allergy    Asthma    Diabetes mellitus without complication (HCC)    GERD (gastroesophageal reflux disease)    History of Clostridium difficile infection    Hyperlipidemia    Hypertension    Myocardial infarction (HCC) 2014   OSA on CPAP    Testicular hypofunction    Umbilical hernia    Past Surgical History:  Procedure Laterality Date   BARIATRIC SURGERY  07/13/2013   BIOPSY  07/19/2020   Procedure: BIOPSY;  Surgeon: Lanelle Bal, DO;  Location: [facility];  Service: Endoscopy;;   COLONOSCOPY WITH PROPOFOL N/A 07/19/2020   Procedure: COLONOSCOPY WITH PROPOFOL;  Surgeon: Lanelle Bal, DO;  Location: [facility];  Service: Endoscopy;  Laterality: N/A;  early am appt   ESOPHAGOGASTRODUODENOSCOPY (EGD) WITH PROPOFOL N/A 07/19/2020   Procedure: ESOPHAGOGASTRODUODENOSCOPY (EGD) WITH PROPOFOL;  Surgeon: Lanelle Bal, DO;  Location: [facility];  Service: Endoscopy;  Laterality: N/A;   HIATAL HERNIA REPAIR     KNEE SURGERY Left 2015   Due to meniscal tear   LEFT HEART CATH AND CORONARY ANGIOGRAPHY N/A 07/28/2020   Procedure: LEFT HEART CATH AND CORONARY ANGIOGRAPHY;  Surgeon: Marykay Lex, MD;  Location: [facility] INVASIVE CV LAB;  Service: Cardiovascular;  Laterality: N/A;   NASAL SEPTUM SURGERY  03/2019   POLYPECTOMY  07/19/2020   Procedure: POLYPECTOMY;  Surgeon: Lanelle Bal, DO;  Location: [facility];  Service: Endoscopy;;   Social History   Socioeconomic History   Marital status: Divorced    Spouse name: Not on file   Number of children: Not on file   Years of education: Not on file   Highest education level: Not on file  Occupational History   Not on file  Tobacco Use   Smoking status: Former    Current  packs/day: 0.00    Types: Cigarettes    Quit date: 02/04/2014    Years since quitting: 9.4   Smokeless tobacco: Never  Vaping Use   Vaping status: Never Used  Substance and Sexual Activity   Alcohol use: Yes    Comment: very rarely    Drug use: Never   Sexual activity: Yes    Birth control/protection: None  Other Topics Concern   Not on file  Social History Narrative   Not on file   Social Drivers of Health   Financial Resource Strain: Not on file  Food Insecurity: Not on file  Transportation Needs: Not on file  Physical Activity: Not on file  Stress: Not on file  Social Connections: Not on file   Family History  Problem Relation Age of Onset   COPD Mother    Autism Son    Skin cancer Maternal Grandmother    Heart attack Maternal Grandfather    Outpatient Encounter Medications as of 07/31/2023  Medication Sig   amLODipine (NORVASC) 10 MG tablet Take 1 tablet (10 mg total) by mouth daily.   Bempedoic Acid-Ezetimibe (NEXLIZET) 180-10 MG TABS Take  1 tablet by mouth daily.   cholecalciferol (VITAMIN D3) 25 MCG (1000 UNIT) tablet Take 1,000 Units by mouth daily. (Patient not taking: Reported on 05/07/2023)   dapagliflozin propanediol (FARXIGA) 10 MG TABS tablet Take 1 tablet (10 mg total) by mouth daily.   Famotidine (PEPCID PO) Take 20 mg by mouth at bedtime.   fluticasone (FLONASE) 50 MCG/ACT nasal spray Place 2 sprays into both nostrils daily.   levocetirizine (XYZAL) 5 MG tablet TAKE 1 TABLET BY MOUTH ONCE DAILY IN THE EVENING   lisinopril (ZESTRIL) 10 MG tablet Take 1 tablet (10 mg total) by mouth daily.   loperamide (IMODIUM) 2 MG capsule Take 6-8 mg by mouth daily as needed for diarrhea or loose stools.   metoprolol tartrate (LOPRESSOR) 25 MG tablet Take 1 tablet (25 mg total) by mouth 2 (two) times daily.   Multiple Vitamin (MULTIVITAMIN ADULT PO) Take 1 tablet by mouth daily.   tirzepatide (MOUNJARO) 10 MG/0.5ML Pen Inject 10 mg into the skin once a week.   No  facility-administered encounter medications on file as of 07/31/2023.   ALLERGIES: Allergies  Allergen Reactions   Other Other (See Comments)    Body aches.  Failed 3 different ones.   Gramineae Pollens    Statins Other (See Comments)    Body aches.  Failed 3 different ones. Body aches.  Failed 3 different ones. Body aches.  Failed 3 different ones. Other reaction(s): Other (See Comments) Body aches.  Failed 3 different ones. Body aches.  Failed 3 different ones.    VACCINATION STATUS: Immunization History  Administered Date(s) Administered   Pneumococcal Polysaccharide-23 01/06/2013   Tdap 01/26/2015    HPI DOMINQUE Tyler is 48 y.o. male who presents today with a medical history as above. he is being seen in follow up after he was seen in consultation for hypogonadism requested by [name], FNP.     Patient reports that he was previously treated for hypogonadism 5 years ago with injectable testosterone replacement.  However he has not received any testosterone recently. His last labs were from October 2024 where his total testosterone was 365.   His subsequent labs did not show hypogonadism- see below. He denies any history of testicular injury, chemotherapy, and radiation therapy.  He denies any significant head injury.  He fathers one 59-year-old child.  He does not have any plan for more children, not worried about keeping his fertility.   He denies ED, only rare low libido.  He is a social alcohol user.  His major complaint seems to be fatigue.  He has multiple medical problems including sleep apnea, coronary artery disease, type 2 diabetes, hypertension, hyperlipidemia, obesity.  Per history, he did have coronary artery disease which required cardiac catheterization/angiography in 2022.  He denies chest pain, shortness of breath.  However, he does not exert enough.  He uses CPAP to sleep. He weighed as high as 325 pounds prior to 2024 when he underwent sleeve  gastrectomy which helped him lose weight down to 265 pounds.  More recently, he is progressively regaining.  His current medications include Mounjaro 10 mg subcutaneously weekly, bempedoic acid-ezetimibe for hypercholesterolemia, amlodipine, metoprolol and and lisinopril for hypertension management. His most recent A1c was 6.3%.  Review of Systems  Constitutional: +  fluctuating body weight status post sleeve gastrectomy in 2014 + fatigue, no subjective hyperthermia, no subjective hypothermia Eyes: no blurry vision, no xerophthalmia ENT: no sore throat, no nodules palpated in throat, no dysphagia/odynophagia, no hoarseness Cardiovascular:  no Chest Pain, no Shortness of Breath, no palpitations, no leg swelling Respiratory: no cough, no shortness of breath Gastrointestinal: no Nausea/Vomiting/Diarhhea Musculoskeletal: no muscle/joint aches Skin: no rashes Neurological: no tremors, no numbness, no tingling, no dizziness Psychiatric: no depression, no anxiety  Objective:       07/31/2023    3:47 PM 07/11/2023    2:34 PM 05/07/2023    4:01 PM  Vitals with BMI  Height 5\' 10"  5\' 10"  5\' 10"   Weight 273 lbs 3 oz 274 lbs 270 lbs 10 oz  BMI 39.2 39.32 38.83  Systolic 122 120 161  Diastolic 62 66 64  Pulse 72 72 62    BP 122/62   Pulse 72   Ht 5\' 10"  (1.778 m)   Wt 273 lb 3.2 oz (123.9 kg)   BMI 39.20 kg/m   Wt Readings from Last 3 Encounters:  07/31/23 273 lb 3.2 oz (123.9 kg)  07/11/23 274 lb (124.3 kg)  05/07/23 270 lb 9.6 oz (122.7 kg)    Physical Exam  Constitutional:  Body mass index is 39.2 kg/m.,  not in acute distress, normal state of mind Eyes: PERRLA, EOMI, no exophthalmos ENT: moist mucous membranes, no gross thyromegaly, no gross cervical lymphadenopathy  Genital Exam: Testicles are 15 cc bilaterally, no intrascrotal mass lesion.  No inguinal or femoral hernia.  Normal male external escutcheon. Neurological: no tremor with outstretched hands, Deep tendon reflexes  normal in bilateral lower extremities.  CMP ( most recent) CMP     Component Value Date/Time   NA 140 04/08/2023 1554   K 4.3 04/08/2023 1554   CL 102 04/08/2023 1554   CO2 24 04/08/2023 1554   GLUCOSE 132 (H) 04/08/2023 1554   GLUCOSE 150 (H) 07/29/2020 0252   BUN 18 04/08/2023 1554   CREATININE 0.94 04/08/2023 1554   CALCIUM 9.4 04/08/2023 1554   PROT 7.5 04/08/2023 1554   ALBUMIN 4.4 04/08/2023 1554   AST 42 (H) 04/08/2023 1554   ALT 46 (H) 04/08/2023 1554   ALKPHOS 61 04/08/2023 1554   BILITOT 0.4 04/08/2023 1554   EGFR 99 04/08/2023 1554   GFRNONAA >60 07/29/2020 0252    Diabetic Labs (most recent): Lab Results  Component Value Date   HGBA1C 6.3 (H) 04/08/2023   HGBA1C 6.6 (H) 12/03/2022   HGBA1C 6.4 (H) 08/21/2022     Lipid Panel ( most recent) Lipid Panel     Component Value Date/Time   CHOL 133 08/21/2022 0840   TRIG 169 (H) 08/21/2022 0840   HDL 41 08/21/2022 0840   CHOLHDL 3.2 08/21/2022 0840   CHOLHDL 4.7 07/29/2020 0252   VLDL 51 (H) 07/29/2020 0252   LDLCALC 63 08/21/2022 0840   LABVLDL 29 08/21/2022 0840      Lab Results  Component Value Date   TSH 1.160 12/03/2022   TSH 1.850 02/08/2020    Recent Results (from the past 2160 hours)  Prolactin     Status: None   Collection Time: 07/15/23  8:06 AM  Result Value Ref Range   Prolactin 6.3 3.9 - 22.7 ng/mL  Ferritin     Status: None   Collection Time: 07/15/23  8:06 AM  Result Value Ref Range   Ferritin 148 30 - 400 ng/mL  Luteinizing hormone     Status: None   Collection Time: 07/15/23  8:06 AM  Result Value Ref Range   LH 7.5 1.7 - 8.6 mIU/mL  Follicle stimulating hormone     Status: None   Collection  Time: 07/15/23  8:06 AM  Result Value Ref Range   FSH 4.6 1.5 - 12.4 mIU/mL  CBC with Differential/Platelet     Status: None   Collection Time: 07/15/23  8:06 AM  Result Value Ref Range   WBC 5.7 3.4 - 10.8 x10E3/uL   RBC 5.10 4.14 - 5.80 x10E6/uL   Hemoglobin 15.3 13.0 - 17.7 g/dL    Hematocrit 09.3 81.8 - 51.0 %   MCV 92 79 - 97 fL   MCH 30.0 26.6 - 33.0 pg   MCHC 32.6 31.5 - 35.7 g/dL   RDW 29.9 37.1 - 69.6 %   Platelets 280 150 - 450 x10E3/uL   Neutrophils 58 Not Estab. %   Lymphs 32 Not Estab. %   Monocytes 7 Not Estab. %   Eos 2 Not Estab. %   Basos 1 Not Estab. %   Neutrophils Absolute 3.4 1.4 - 7.0 x10E3/uL   Lymphocytes Absolute 1.8 0.7 - 3.1 x10E3/uL   Monocytes Absolute 0.4 0.1 - 0.9 x10E3/uL   EOS (ABSOLUTE) 0.1 0.0 - 0.4 x10E3/uL   Basophils Absolute 0.0 0.0 - 0.2 x10E3/uL   Immature Granulocytes 0 Not Estab. %   Immature Grans (Abs) 0.0 0.0 - 0.1 x10E3/uL  PSA     Status: None   Collection Time: 07/15/23  8:06 AM  Result Value Ref Range   Prostate Specific Ag, Serum 1.4 0.0 - 4.0 ng/mL    Comment: Roche ECLIA methodology. According to the American Urological Association, Serum PSA should decrease and remain at undetectable levels after radical prostatectomy. The AUA defines biochemical recurrence as an initial PSA value 0.2 ng/mL or greater followed by a subsequent confirmatory PSA value 0.2 ng/mL or greater. Values obtained with different assay methods or kits cannot be used interchangeably. Results cannot be interpreted as absolute evidence of the presence or absence of malignant disease.   Testosterone, Free, Total, SHBG     Status: None   Collection Time: 07/15/23  8:06 AM  Result Value Ref Range   Testosterone 638 264 - 916 ng/dL    Comment: Adult male reference interval is based on a population of healthy nonobese males (BMI <30) between 81 and 35 years old. Travison, et.al. JCEM [phone]. PMID: [id number].    Testosterone, Free 10.1 6.8 - 21.5 pg/mL   Sex Hormone Binding 53.0 16.5 - 55.9 nmol/L           Assessment & Plan:   1. Hypogonadism- resolved   - DONNIVAN VILLENA  is being seen at a kind request of Hawks, Edilia Bo, FNP. - I have reviewed his  new and available  records and clinically evaluated the  patient. - Based on these reviews, he does not have current hypogonadism with total testosterone of 638 with normal gonadotropins. He is not a candidate for androgen replacement therapy.  He will benefit the most from management of his metabolic syndrome, sleep apnea.  He declines my offer for lifestyle medicine.  He is advised to continue his current medications to treat type 2 diabetes, hypertension, hyperlipidemia. This patient's risk for cardiovascular disease exceedingly high.  He would benefit from reassessment with at least a stress test via cardiology; hence needs referral to cardiology by his PCP. He is interested to know his adrenal function, no reason to suspect adrenal dysfunction but may have a AM cortisol during his next labs with his PCP.  - I did not initiate any new prescriptions today. - he is advised to maintain close follow  up with [name], FNP for primary care needs.   I spent  22 minutes in the care of the patient today including review of labs from Thyroid Function, CMP, and other relevant labs ; imaging/biopsy records (current and previous including abstractions from other facilities); face-to-face time discussing  his lab results and symptoms, medications doses, his options of short and long term treatment based on the latest standards of care / guidelines;   and documenting the encounter.  Jimmy Tyler  participated in the discussions, expressed understanding, and voiced agreement with the above plans.  All questions were answered to his satisfaction. he is encouraged to contact clinic should he have any questions or concerns prior to his return visit.   Follow up plan: Return if symptoms worsen or fail to improve.   Marquis Lunch, MD [facility] [address] [place], [place] [address] Phone: [phone]  Fax: [phone]     07/31/2023, 6:57 PM  This note was partially dictated with voice  recognition software. Similar sounding words can be transcribed inadequately or may not  be corrected upon review.

## 2023-08-15 ENCOUNTER — Ambulatory Visit: Admitting: Family

## 2023-08-15 VITALS — BP 114/71 | HR 58 | Temp 98.1°F | Wt 269.0 lb

## 2023-08-15 DIAGNOSIS — K219 Gastro-esophageal reflux disease without esophagitis: Secondary | ICD-10-CM | POA: Diagnosis not present

## 2023-08-15 DIAGNOSIS — E1159 Type 2 diabetes mellitus with other circulatory complications: Secondary | ICD-10-CM

## 2023-08-15 DIAGNOSIS — E559 Vitamin D deficiency, unspecified: Secondary | ICD-10-CM

## 2023-08-15 DIAGNOSIS — I152 Hypertension secondary to endocrine disorders: Secondary | ICD-10-CM

## 2023-08-15 DIAGNOSIS — G4733 Obstructive sleep apnea (adult) (pediatric): Secondary | ICD-10-CM

## 2023-08-15 DIAGNOSIS — G720 Drug-induced myopathy: Secondary | ICD-10-CM

## 2023-08-15 DIAGNOSIS — I252 Old myocardial infarction: Secondary | ICD-10-CM

## 2023-08-15 DIAGNOSIS — E1165 Type 2 diabetes mellitus with hyperglycemia: Secondary | ICD-10-CM | POA: Diagnosis not present

## 2023-08-15 DIAGNOSIS — Z9884 Bariatric surgery status: Secondary | ICD-10-CM

## 2023-08-15 DIAGNOSIS — E782 Mixed hyperlipidemia: Secondary | ICD-10-CM

## 2023-08-15 DIAGNOSIS — F320 Major depressive disorder, single episode, mild: Secondary | ICD-10-CM

## 2023-08-15 LAB — BAYER DCA HB A1C WAIVED: HB A1C (BAYER DCA - WAIVED): 6.3 % — ABNORMAL HIGH (ref 4.8–5.6)

## 2023-08-15 MED ORDER — TIRZEPATIDE 12.5 MG/0.5ML ~~LOC~~ SOAJ: 12.5000 mg | SUBCUTANEOUS | 2 refills | Status: DC

## 2023-08-15 MED ORDER — BUPROPION HCL ER (XL) 150 MG PO TB24: 150.0000 mg | ORAL_TABLET | Freq: Every day | ORAL | 1 refills | Status: DC

## 2023-08-15 NOTE — Patient Instructions (Signed)

## 2023-08-15 NOTE — Progress Notes (Signed)
 Subjective:    Patient ID: Jimmy Tyler, male    DOB: February 18, 1976, 48 y.o.   MRN: [id number]  Chief Complaint  Patient presents with   Medical Management of Chronic Issues    Follow up from endocrinologist    Pt presents to the office today for chronic follow up.    Has hx of MI. Is not followed by Cardiologists.    Pt states he can not tolerate statins because of myalgia.     Has OSA and uses CPAP nightly.    He is morbid obese with BMI of 38 with DM and HTN.    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   He is taking Mounjaro 10 mg. His starting weight was 290 lb. He has lost 21 lbs.      08/15/2023   10:40 AM 07/31/2023    3:47 PM 07/11/2023    2:34 PM  Last 3 Weights  Weight (lbs) 269 lb 273 lb 3.2 oz 274 lb  Weight (kg) 122.018 kg 123.923 kg 124.286 kg    He is complaining of fatigue. He saw Endocrinologists and told his testosterone was stable. He is still complaining of fatigue and lack of motivation.  Hypertension This is a chronic problem. The current episode started more than 1 year ago. The problem has been resolved since onset. The problem is controlled. Associated symptoms include malaise/fatigue. Pertinent negatives include no blurred vision, peripheral edema or shortness of breath. Risk factors for coronary artery disease include dyslipidemia, diabetes mellitus, obesity, male gender and sedentary lifestyle. The current treatment provides moderate improvement.  Diabetes He presents for his follow-up diabetic visit. He has type 2 diabetes mellitus. Associated symptoms include fatigue. Pertinent negatives for diabetes include no blurred vision and no foot paresthesias. Symptoms are stable. Pertinent negatives for diabetic complications include no peripheral neuropathy. Risk factors for coronary artery disease include dyslipidemia, diabetes mellitus, hypertension and sedentary lifestyle. He is following a generally healthy diet. (Does  check glucose at home)  Gastroesophageal Reflux He complains of belching and heartburn. This is a chronic problem. The current episode started more than 1 year ago. The problem occurs occasionally. The symptoms are aggravated by certain foods. Associated symptoms include fatigue. Risk factors include obesity. The treatment provided moderate relief.  Hyperlipidemia This is a chronic problem. The current episode started more than 1 year ago. The problem is controlled. Exacerbating diseases include obesity. Pertinent negatives include no shortness of breath. Current antihyperlipidemic treatment includes ezetimibe. The current treatment provides moderate improvement of lipids. Risk factors for coronary artery disease include dyslipidemia, diabetes mellitus, hypertension, male sex and a sedentary lifestyle.  Depression        This is a recurrent problem.  The current episode started more than 1 year ago.   Associated symptoms include fatigue, irritable, decreased interest and sad.  Associated symptoms include no helplessness, no hopelessness and no restlessness.  Past treatments include nothing.     Review of Systems  Constitutional:  Positive for fatigue and malaise/fatigue.  Eyes:  Negative for blurred vision.  Respiratory:  Negative for shortness of breath.   Gastrointestinal:  Positive for heartburn.  Psychiatric/Behavioral:  Positive for depression.   All other systems reviewed and are negative.      Objective:   Physical Exam Vitals reviewed.  Constitutional:      General: He is irritable. He is not in acute distress.    Appearance: He is well-developed. He is obese.  HENT:     Head: Normocephalic.     Right Ear: Tympanic membrane normal.     Left Ear: Tympanic membrane normal.  Eyes:     General:        Right eye: No discharge.        Left eye: No discharge.     Pupils: Pupils are equal, round, and reactive to light.  Neck:     Thyroid: No thyromegaly.  Cardiovascular:      Rate and Rhythm: Normal rate and regular rhythm.     Heart sounds: Normal heart sounds. No murmur heard. Pulmonary:     Effort: Pulmonary effort is normal. No respiratory distress.     Breath sounds: Normal breath sounds. No wheezing.  Abdominal:     General: Bowel sounds are normal. There is no distension.     Palpations: Abdomen is soft.     Tenderness: There is no abdominal tenderness.  Musculoskeletal:        General: No tenderness. Normal range of motion.     Cervical back: Normal range of motion and neck supple.  Skin:    General: Skin is warm and dry.     Findings: No erythema or rash.  Neurological:     Mental Status: He is alert and oriented to person, place, and time.     Cranial Nerves: No cranial nerve deficit.     Deep Tendon Reflexes: Reflexes are normal and symmetric.  Psychiatric:        Behavior: Behavior normal.        Thought Content: Thought content normal.        Judgment: Judgment normal.      BP 114/71   Pulse (!) 58   Temp 98.1 F (36.7 C) (Temporal)   Wt 269 lb (122 kg)   SpO2 97%   BMI 38.60 kg/m       Assessment & Plan:  Jimmy Tyler comes in today with chief complaint of Medical Management of Chronic Issues (Follow up from endocrinologist )   Diagnosis and orders addressed:  1. Gastroesophageal reflux disease without esophagitis - CMP14+EGFR  2. History of myocardial infarction - CMP14+EGFR  3. Hypertension associated with type 2 diabetes mellitus (HCC) - CMP14+EGFR  4. Mixed hyperlipidemia - CMP14+EGFR  5. Morbid obesity (HCC) - CMP14+EGFR  6. OSA (obstructive sleep apnea) - CMP14+EGFR  7. S/P laparoscopic sleeve gastrectomy - CMP14+EGFR  8. Statin myopathy - CMP14+EGFR  9. Type 2 diabetes mellitus with hyperglycemia, without long-term current use of insulin (HCC) (Primary) Will increase Mounjaro 12.5 mg Low carb diet  - tirzepatide (MOUNJARO) 12.5 MG/0.5ML Pen; Inject 12.5 mg into the skin once a week.  Dispense:  6 mL; Refill: 2 - Bayer DCA Hb A1c Waived - CMP14+EGFR  10. Vitamin D deficiency - CMP14+EGFR  11. Depression, major, single episode, mild (HCC) Start Wellbutrin 150 mg  Stress management  - buPROPion (WELLBUTRIN XL) 150 MG 24 hr tablet; Take 1 tablet (150 mg total) by mouth daily.  Dispense: 90 tablet; Refill: 1   Labs pending Will increase Mounjaro to 12.5 mg from 10 mg Will start Wellbutrin 150 mg  Stress management  Continue current medication  Health Maintenance reviewed- Diabetic eye exam pending  Diet and exercise encouraged  Follow up plan: 6 weeks to recheck depression   Jannifer Rodney, FNP

## 2023-08-16 LAB — CMP14+EGFR
ALT: 47 IU/L — ABNORMAL HIGH (ref 0–44)
AST: 31 IU/L (ref 0–40)
Albumin: 4.7 g/dL (ref 4.1–5.1)
Alkaline Phosphatase: 65 IU/L (ref 44–121)
BUN/Creatinine Ratio: 16 (ref 9–20)
BUN: 16 mg/dL (ref 6–24)
Bilirubin Total: 0.6 mg/dL (ref 0.0–1.2)
CO2: 23 mmol/L (ref 20–29)
Calcium: 9.7 mg/dL (ref 8.7–10.2)
Chloride: 100 mmol/L (ref 96–106)
Creatinine, Ser: 0.98 mg/dL (ref 0.76–1.27)
Globulin, Total: 3.2 g/dL (ref 1.5–4.5)
Glucose: 93 mg/dL (ref 70–99)
Potassium: 4.5 mmol/L (ref 3.5–5.2)
Sodium: 138 mmol/L (ref 134–144)
Total Protein: 7.9 g/dL (ref 6.0–8.5)
eGFR: 95 mL/min/{1.73_m2} (ref >59)

## 2023-09-30 ENCOUNTER — Encounter (HOSPITAL_COMMUNITY): Payer: Self-pay

## 2023-10-03 ENCOUNTER — Other Ambulatory Visit: Payer: Self-pay | Admitting: Family

## 2023-10-03 DIAGNOSIS — E1159 Type 2 diabetes mellitus with other circulatory complications: Secondary | ICD-10-CM

## 2023-10-03 DIAGNOSIS — I201 Angina pectoris with documented spasm: Secondary | ICD-10-CM

## 2023-10-07 ENCOUNTER — Ambulatory Visit: Admitting: Family

## 2023-10-07 ENCOUNTER — Encounter: Payer: Self-pay | Admitting: Family

## 2023-11-01 ENCOUNTER — Ambulatory Visit: Admitting: Family

## 2023-11-26 ENCOUNTER — Encounter: Payer: Self-pay | Admitting: Family

## 2023-11-26 ENCOUNTER — Ambulatory Visit: Admitting: Family

## 2023-11-26 VITALS — BP 108/59 | HR 63 | Temp 97.7°F | Ht 70.0 in | Wt 276.5 lb

## 2023-11-26 DIAGNOSIS — Z7985 Long-term (current) use of injectable non-insulin antidiabetic drugs: Secondary | ICD-10-CM

## 2023-11-26 DIAGNOSIS — Z0000 Encounter for general adult medical examination without abnormal findings: Secondary | ICD-10-CM

## 2023-11-26 DIAGNOSIS — F411 Generalized anxiety disorder: Secondary | ICD-10-CM

## 2023-11-26 DIAGNOSIS — E1165 Type 2 diabetes mellitus with hyperglycemia: Secondary | ICD-10-CM | POA: Diagnosis not present

## 2023-11-26 DIAGNOSIS — E782 Mixed hyperlipidemia: Secondary | ICD-10-CM

## 2023-11-26 DIAGNOSIS — Z1211 Encounter for screening for malignant neoplasm of colon: Secondary | ICD-10-CM

## 2023-11-26 DIAGNOSIS — I152 Hypertension secondary to endocrine disorders: Secondary | ICD-10-CM

## 2023-11-26 DIAGNOSIS — E559 Vitamin D deficiency, unspecified: Secondary | ICD-10-CM

## 2023-11-26 DIAGNOSIS — K219 Gastro-esophageal reflux disease without esophagitis: Secondary | ICD-10-CM

## 2023-11-26 DIAGNOSIS — E1159 Type 2 diabetes mellitus with other circulatory complications: Secondary | ICD-10-CM

## 2023-11-26 DIAGNOSIS — I201 Angina pectoris with documented spasm: Secondary | ICD-10-CM

## 2023-11-26 DIAGNOSIS — F320 Major depressive disorder, single episode, mild: Secondary | ICD-10-CM

## 2023-11-26 DIAGNOSIS — G4733 Obstructive sleep apnea (adult) (pediatric): Secondary | ICD-10-CM

## 2023-11-26 LAB — BAYER DCA HB A1C WAIVED: HB A1C (BAYER DCA - WAIVED): 6.3 % — ABNORMAL HIGH (ref 4.8–5.6)

## 2023-11-26 MED ORDER — TIRZEPATIDE 15 MG/0.5ML ~~LOC~~ SOAJ: 15.0000 mg | SUBCUTANEOUS | 2 refills | Status: AC

## 2023-11-26 MED ORDER — DAPAGLIFLOZIN PROPANEDIOL 10 MG PO TABS: 10.0000 mg | ORAL_TABLET | Freq: Every day | ORAL | 1 refills | Status: DC

## 2023-11-26 MED ORDER — BUPROPION HCL ER (XL) 300 MG PO TB24: 300.0000 mg | ORAL_TABLET | Freq: Every day | ORAL | 1 refills | Status: DC

## 2023-11-26 MED ORDER — METOPROLOL TARTRATE 25 MG PO TABS: 25.0000 mg | ORAL_TABLET | Freq: Two times a day (BID) | ORAL | 0 refills | Status: DC

## 2023-11-26 MED ORDER — AMLODIPINE BESYLATE 10 MG PO TABS: 10.0000 mg | ORAL_TABLET | Freq: Every day | ORAL | 0 refills | Status: DC

## 2023-11-26 MED ORDER — NEXLIZET 180-10 MG PO TABS: 1.0000 | ORAL_TABLET | Freq: Every day | ORAL | 1 refills | Status: DC

## 2023-11-26 MED ORDER — LISINOPRIL 10 MG PO TABS: 10.0000 mg | ORAL_TABLET | Freq: Every day | ORAL | 1 refills | Status: DC

## 2023-11-26 NOTE — Progress Notes (Signed)
 Subjective:    Patient ID: Jimmy Tyler, male    DOB: [birth year], 48 y.o.   MRN: [id number]  Chief Complaint  Patient presents with   Medical Management of Chronic Issues   Pt presents to the office today for CPE and chronic follow up.    Has hx of MI. Is not followed by Cardiologists.    Pt states he can not tolerate statins because of myalgia.     Has OSA and uses CPAP nightly.    He is morbid obese with BMI of 39 with DM and HTN.    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   He is taking Mounjaro  12.5 mg. His starting weight was 290 lb. He has lost 14 lbs.      11/26/2023    3:31 PM 08/15/2023   10:40 AM 07/31/2023    3:47 PM  Last 3 Weights  Weight (lbs) 276 lb 8 oz 269 lb 273 lb 3.2 oz  Weight (kg) 125.42 kg 122.018 kg 123.923 kg    Hypertension This is a chronic problem. The current episode started more than 1 year ago. The problem has been resolved since onset. The problem is controlled. Associated symptoms include malaise/fatigue. Pertinent negatives include no blurred vision, peripheral edema or shortness of breath. Risk factors for coronary artery disease include dyslipidemia, diabetes mellitus, obesity, male gender and sedentary lifestyle. The current treatment provides moderate improvement.  Diabetes He presents for his follow-up diabetic visit. He has type 2 diabetes mellitus. Associated symptoms include fatigue. Pertinent negatives for diabetes include no blurred vision and no foot paresthesias. Symptoms are stable. Pertinent negatives for diabetic complications include no peripheral neuropathy. Risk factors for coronary artery disease include dyslipidemia, diabetes mellitus, hypertension and sedentary lifestyle. He is following a generally healthy diet. (Does check glucose at home)  Gastroesophageal Reflux He complains of belching and heartburn. This is a chronic problem. The current episode started more than 1 year ago. The  problem occurs occasionally. The symptoms are aggravated by certain foods. Associated symptoms include fatigue. Risk factors include obesity. He has tried a PPI for the symptoms. The treatment provided moderate relief.  Hyperlipidemia This is a chronic problem. The current episode started more than 1 year ago. The problem is controlled. Exacerbating diseases include obesity. Pertinent negatives include no shortness of breath. Current antihyperlipidemic treatment includes ezetimibe . The current treatment provides moderate improvement of lipids. Risk factors for coronary artery disease include dyslipidemia, diabetes mellitus, hypertension, male sex and a sedentary lifestyle.  Depression        This is a recurrent problem.  The current episode started more than 1 year ago.   Associated symptoms include fatigue, irritable, decreased interest and sad.  Associated symptoms include no helplessness, no hopelessness and no restlessness.  Past treatments include nothing.     Review of Systems  Constitutional:  Positive for fatigue and malaise/fatigue.  Eyes:  Negative for blurred vision.  Respiratory:  Negative for shortness of breath.   Gastrointestinal:  Positive for heartburn.  All other systems reviewed and are negative.  Family History  Problem Relation Age of Onset   COPD Mother    Autism Son    Skin cancer Maternal Grandmother    Heart attack Maternal Grandfather    Social History   Socioeconomic History   Marital status: Divorced    Spouse name: Not on file   Number of children: Not on file   Years of education:  Not on file   Highest education level: Associate degree: occupational, technical, or vocational program  Occupational History   Not on file  Tobacco Use   Smoking status: Former    Current packs/day: 0.00    Types: Cigarettes    Quit date: 02/04/2014    Years since quitting: 9.8   Smokeless tobacco: Never  Vaping Use   Vaping status: Never Used  Substance and Sexual  Activity   Alcohol use: Yes    Comment: very rarely    Drug use: Never   Sexual activity: Yes    Birth control/protection: None  Other Topics Concern   Not on file  Social History Narrative   Not on file   Social Drivers of Health   Financial Resource Strain: Not on file  Food Insecurity: No Food Insecurity (08/15/2023)   Hunger Vital Sign    Worried About Running Out of Food in the Last Year: Never true    Ran Out of Food in the Last Year: Never true  Transportation Needs: No Transportation Needs (08/15/2023)   PRAPARE - Administrator, Civil Service (Medical): No    Lack of Transportation (Non-Medical): No  Physical Activity: Unknown (08/15/2023)   Exercise Vital Sign    Days of Exercise per Week: 0 days    Minutes of Exercise per Session: Not on file  Stress: No Stress Concern Present (08/15/2023)   [employer] of Occupational Health - Occupational Stress Questionnaire    Feeling of Stress : Not at all  Social Connections: Socially Isolated (08/15/2023)   Social Connection and Isolation Panel    Frequency of Communication with Friends and Family: Never    Frequency of Social Gatherings with Friends and Family: Never    Attends Religious Services: Never    Database administrator or Organizations: Yes    Attends Engineer, structural: More than 4 times per year    Marital Status: Divorced       Objective:   Physical Exam Vitals reviewed.  Constitutional:      General: He is irritable. He is not in acute distress.    Appearance: He is well-developed. He is obese.  HENT:     Head: Normocephalic.     Right Ear: Tympanic membrane normal.     Left Ear: Tympanic membrane normal.  Eyes:     General:        Right eye: No discharge.        Left eye: No discharge.     Pupils: Pupils are equal, round, and reactive to light.  Neck:     Thyroid : No thyromegaly.  Cardiovascular:     Rate and Rhythm: Normal rate and regular rhythm.     Heart sounds:  Normal heart sounds. No murmur heard. Pulmonary:     Effort: Pulmonary effort is normal. No respiratory distress.     Breath sounds: Normal breath sounds. No wheezing.  Abdominal:     General: Bowel sounds are normal. There is no distension.     Palpations: Abdomen is soft.     Tenderness: There is no abdominal tenderness.  Musculoskeletal:        General: No tenderness. Normal range of motion.     Cervical back: Normal range of motion and neck supple.     Right lower leg: Edema (trace) present.     Left lower leg: Edema (trace) present.  Skin:    General: Skin is warm and dry.  Findings: No erythema or rash.  Neurological:     Mental Status: He is alert and oriented to person, place, and time.     Cranial Nerves: No cranial nerve deficit.     Deep Tendon Reflexes: Reflexes are normal and symmetric.  Psychiatric:        Behavior: Behavior normal.        Thought Content: Thought content normal.        Judgment: Judgment normal.    Diabetic Foot Exam - Simple   Simple Foot Form Diabetic Foot exam was performed with the following findings: Yes 11/26/2023  3:52 PM  Visual Inspection No deformities, no ulcerations, no other skin breakdown bilaterally: Yes Sensation Testing Intact to touch and monofilament testing bilaterally: Yes Pulse Check Posterior Tibialis and Dorsalis pulse intact bilaterally: Yes Comments Toenails mildly discolored, mild thickness        BP (!) 108/59   Pulse 63   Temp 97.7 F (36.5 C) (Temporal)   Ht 5' 10 (1.778 m)   Wt 276 lb 8 oz (125.4 kg)   SpO2 100%   BMI 39.67 kg/m       Assessment & Plan:  Jimmy Tyler comes in today with chief complaint of Medical Management of Chronic Issues   Diagnosis and orders addressed:  1. Annual physical exam (Primary) - Bayer DCA Hb A1c Waived - CBC with Differential/Platelet - Lipid panel - CMP14+EGFR - Microalbumin / creatinine urine ratio  2. Type 2 diabetes mellitus with hyperglycemia,  without long-term current use of insulin  (HCC) - dapagliflozin  propanediol (FARXIGA ) 10 MG TABS tablet; Take 1 tablet (10 mg total) by mouth daily.  Dispense: 90 tablet; Refill: 1 - lisinopril  (ZESTRIL ) 10 MG tablet; Take 1 tablet (10 mg total) by mouth daily.  Dispense: 90 tablet; Refill: 1 - Bayer DCA Hb A1c Waived - CBC with Differential/Platelet - CMP14+EGFR - Microalbumin / creatinine urine ratio  3. Mixed hyperlipidemia - Bempedoic Acid -Ezetimibe  (NEXLIZET ) 180-10 MG TABS; Take 1 tablet by mouth daily.  Dispense: 90 tablet; Refill: 1 - CBC with Differential/Platelet - Lipid panel - CMP14+EGFR  4. Hypertension associated with type 2 diabetes mellitus (HCC) - amLODipine  (NORVASC ) 10 MG tablet; Take 1 tablet (10 mg total) by mouth daily.  Dispense: 90 tablet; Refill: 0 - lisinopril  (ZESTRIL ) 10 MG tablet; Take 1 tablet (10 mg total) by mouth daily.  Dispense: 90 tablet; Refill: 1 - metoprolol  tartrate (LOPRESSOR ) 25 MG tablet; Take 1 tablet (25 mg total) by mouth 2 (two) times daily.  Dispense: 180 tablet; Refill: 0 - CBC with Differential/Platelet - CMP14+EGFR  5. Morbid obesity (HCC) - CBC with Differential/Platelet - CMP14+EGFR  6. OSA (obstructive sleep apnea) - CBC with Differential/Platelet - CMP14+EGFR  7. Vitamin D  deficiency - CBC with Differential/Platelet - CMP14+EGFR  8. Gastroesophageal reflux disease without esophagitis - CBC with Differential/Platelet - CMP14+EGFR  9. Coronary vasospasm (HCC) - amLODipine  (NORVASC ) 10 MG tablet; Take 1 tablet (10 mg total) by mouth daily.  Dispense: 90 tablet; Refill: 0 - CBC with Differential/Platelet - CMP14+EGFR  10. Colon cancer screening - Ambulatory referral to Gastroenterology  11. GAD (generalized anxiety disorder) Will increase Wellbutrin  300 mg from 150 mg  Stress management   12. Depression, major, single episode, mild (HCC) Will increase Wellbutrin  300 mg from 150 mg  Stress management   Labs  pending Will increase Mounjaro  to 15 mg from 12.5 mg Will increase t Wellbutrin  300 mg from 150 mg Stress management  Continue current medication  Health  Maintenance reviewed Diet and exercise encouraged  Follow up plan: 3 months    Bari Learn, FNP

## 2023-11-26 NOTE — Patient Instructions (Signed)
 GERD in Adults: What to Know  Gastroesophageal reflux (GER) is when acid from your stomach flows up into your esophagus. Your esophagus is the part of your body that moves food from your mouth to your stomach. Normally, food goes down and stays in your stomach to be digested. But with GER, food and stomach acid may go back up. You may have a disease called gastroesophageal reflux disease (GERD) if the reflux: Happens often. Causes very bad symptoms. Makes your esophagus sore and swollen. Over time, GERD can make small holes called ulcers in the lining of your esophagus. What are the causes? GERD is caused by a problem with the muscle between your esophagus and stomach. This muscle is called the lower esophageal sphincter (LES). When it's weak or not normal, it doesn't close like it should. This means food and stomach acid can go back up into your esophagus. The muscle can be weak if: You smoke or use products with tobacco in them. You're pregnant. You have a type of hernia called a hiatal hernia. You eat certain foods and drinks. These include: Alcohol. Coffee. Chocolate. Onions. Peppermint. What increases the risk? Being overweight. Having a disease that affects your connective tissue. Taking NSAIDs, such as ibuprofen. What are the signs or symptoms? Heartburn. Trouble swallowing. Pain when you swallow. The feeling of having a lump in your throat. A bitter taste in your mouth. Bad breath. Having an upset or bloated stomach. Burping. Chest pain. Other conditions can also cause chest pain. Make sure you see your health care provider if you have chest pain. Wheezing. This is when you make high-pitched whistling sounds when you breathe, most often when you breathe out. A long-term cough or a cough at night. How is this diagnosed? GERD may be diagnosed based on your medical history and a physical exam. You may also have tests. These may include: An endoscopy. This test looks at your  stomach and esophagus with a small camera. A barium swallow test. This shows the shape and size of your esophagus and how well it's working. Tests of your esophagus to check for: Acid levels. Pressure. How is this treated? Treatment may depend on how bad your symptoms are. It may include: Changes to your diet and daily life. Medicines. Surgery. Follow these instructions at home: Eating and drinking Follow an eating plan as told by your provider. You may need to avoid certain foods and drinks. These may include: Coffee and tea, with or without caffeine. Alcohol. Energy drinks and sports drinks. Fizzy drinks or sodas. Chocolate and cocoa. Peppermint and mint flavorings. Garlic and onions. Horseradish. Spicy and acidic foods. These include: Peppers. Chili powder and curry powder. Vinegar. Hot sauces and BBQ sauce. Citrus fruits and juices. These include: Oranges. Lemons. Limes. Tomato-based foods. These include: Red sauce and pizza with red sauce. Chili. Salsa. Fried and fatty foods. These include: Donuts. [place] fries. Potato chips. High-fat dressings. High-fat meats. These include: Hot dogs and sausage. Rib eye steak. Ham and bacon. High-fat dairy items. These include: Whole milk. Butter. Cream cheese. Eat small meals often. Avoid eating big meals. Avoid drinking lots of liquid with your meals. Try not to eat meals during the 2-3 hours before bedtime. Try not to lie down right after you eat. Do not exercise right after you eat. Lifestyle  If you're overweight, lose an amount of weight that's healthy for you. Ask your provider about a safe weight loss goal. Do not smoke, vape, or use nicotine or tobacco. Wear  loose clothes. Do not wear things that are tight around your waist. When you sleep, try: Raising the head of your bed about 6 inches (15 cm). You can use a wedge to do this. Lying down on your left side. Try to lower your stress. If you need help doing  this, ask your provider. General instructions Take your medicines only as told. Do not take aspirin or ibuprofen unless you're told to. Watch for any changes in your symptoms. Do not bend over if it makes your symptoms worse. Contact a health care provider if: You have new symptoms. You have trouble: Drinking. Swallowing. Eating. It hurts to swallow. You have wheezing. You have a cough that won't go away. Your voice is hoarse. Your symptoms don't get better with treatment. Get help right away if: You have pain all of a sudden in your: Arm. Neck. Jaw. Teeth. Back. You feel sweaty, dizzy, or light-headed all of a sudden. You faint. You have chest pain or shortness of breath. You vomit and the vomit is: Green, yellow, or black. Looks like blood or coffee grounds. Your poop is red, bloody, or black. These symptoms may be an emergency. Call 911 right away. Do not wait to see if the symptoms will go away. Do not drive yourself to the hospital. This information is not intended to replace advice given to you by your health care provider. Make sure you discuss any questions you have with your health care provider. Document Revised: 03/19/2023 Document Reviewed: 10/03/2022 Elsevier Patient Education  2024 [employer].

## 2023-11-27 LAB — CBC WITH DIFFERENTIAL/PLATELET
Basophils Absolute: 0 x10E3/uL (ref 0.0–0.2)
Basos: 1 %
EOS (ABSOLUTE): 0.1 x10E3/uL (ref 0.0–0.4)
Eos: 3 %
Hematocrit: 41.8 % (ref 37.5–51.0)
Hemoglobin: 14.1 g/dL (ref 13.0–17.7)
Immature Grans (Abs): 0 x10E3/uL (ref 0.0–0.1)
Immature Granulocytes: 0 %
Lymphocytes Absolute: 1.8 x10E3/uL (ref 0.7–3.1)
Lymphs: 34 %
MCH: 31 pg (ref 26.6–33.0)
MCHC: 33.7 g/dL (ref 31.5–35.7)
MCV: 92 fL (ref 79–97)
Monocytes Absolute: 0.3 x10E3/uL (ref 0.1–0.9)
Monocytes: 7 %
Neutrophils Absolute: 2.9 x10E3/uL (ref 1.4–7.0)
Neutrophils: 55 %
Platelets: 248 x10E3/uL (ref 150–450)
RBC: 4.55 x10E6/uL (ref 4.14–5.80)
RDW: 12.9 % (ref 11.6–15.4)
WBC: 5.2 x10E3/uL (ref 3.4–10.8)

## 2023-11-27 LAB — CMP14+EGFR
ALT: 54 IU/L — ABNORMAL HIGH (ref 0–44)
AST: 43 IU/L — ABNORMAL HIGH (ref 0–40)
Albumin: 4.5 g/dL (ref 4.1–5.1)
Alkaline Phosphatase: 65 IU/L (ref 44–121)
BUN/Creatinine Ratio: 14 (ref 9–20)
BUN: 16 mg/dL (ref 6–24)
Bilirubin Total: 0.4 mg/dL (ref 0.0–1.2)
CO2: 22 mmol/L (ref 20–29)
Calcium: 9.4 mg/dL (ref 8.7–10.2)
Chloride: 101 mmol/L (ref 96–106)
Creatinine, Ser: 1.13 mg/dL (ref 0.76–1.27)
Globulin, Total: 2.9 g/dL (ref 1.5–4.5)
Glucose: 156 mg/dL — ABNORMAL HIGH (ref 70–99)
Potassium: 4.3 mmol/L (ref 3.5–5.2)
Sodium: 138 mmol/L (ref 134–144)
Total Protein: 7.4 g/dL (ref 6.0–8.5)
eGFR: 79 mL/min/1.73 (ref >59)

## 2023-11-27 LAB — MICROALBUMIN / CREATININE URINE RATIO
Creatinine, Urine: 63.2 mg/dL
Microalb/Creat Ratio: <5 mg/g{creat} (ref 0–29)
Microalbumin, Urine: <3 ug/mL

## 2023-11-27 LAB — LIPID PANEL
Chol/HDL Ratio: 4 ratio (ref 0.0–5.0)
Cholesterol, Total: 149 mg/dL (ref 100–199)
HDL: 37 mg/dL — ABNORMAL LOW (ref >39)
LDL Chol Calc (NIH): 68 mg/dL (ref 0–99)
Triglycerides: 271 mg/dL — ABNORMAL HIGH (ref 0–149)
VLDL Cholesterol Cal: 44 mg/dL — ABNORMAL HIGH (ref 5–40)

## 2023-11-28 ENCOUNTER — Ambulatory Visit: Payer: Self-pay | Admitting: Family

## 2024-01-22 ENCOUNTER — Encounter: Payer: Self-pay | Admitting: *Deleted

## 2024-02-06 ENCOUNTER — Other Ambulatory Visit: Payer: Self-pay | Admitting: Family

## 2024-02-06 DIAGNOSIS — F320 Major depressive disorder, single episode, mild: Secondary | ICD-10-CM

## 2024-03-02 ENCOUNTER — Ambulatory Visit: Admitting: Family

## 2024-03-05 ENCOUNTER — Ambulatory Visit: Admitting: Family

## 2024-03-05 VITALS — BP 109/62 | HR 63 | Temp 97.9°F | Ht 70.0 in | Wt 261.2 lb

## 2024-03-05 DIAGNOSIS — I201 Angina pectoris with documented spasm: Secondary | ICD-10-CM

## 2024-03-05 DIAGNOSIS — I152 Hypertension secondary to endocrine disorders: Secondary | ICD-10-CM

## 2024-03-05 DIAGNOSIS — E1159 Type 2 diabetes mellitus with other circulatory complications: Secondary | ICD-10-CM

## 2024-03-05 DIAGNOSIS — E559 Vitamin D deficiency, unspecified: Secondary | ICD-10-CM

## 2024-03-05 DIAGNOSIS — F320 Major depressive disorder, single episode, mild: Secondary | ICD-10-CM | POA: Diagnosis not present

## 2024-03-05 DIAGNOSIS — G4733 Obstructive sleep apnea (adult) (pediatric): Secondary | ICD-10-CM

## 2024-03-05 DIAGNOSIS — K219 Gastro-esophageal reflux disease without esophagitis: Secondary | ICD-10-CM

## 2024-03-05 DIAGNOSIS — E782 Mixed hyperlipidemia: Secondary | ICD-10-CM

## 2024-03-05 DIAGNOSIS — G720 Drug-induced myopathy: Secondary | ICD-10-CM

## 2024-03-05 DIAGNOSIS — J302 Other seasonal allergic rhinitis: Secondary | ICD-10-CM | POA: Diagnosis not present

## 2024-03-05 DIAGNOSIS — F411 Generalized anxiety disorder: Secondary | ICD-10-CM

## 2024-03-05 DIAGNOSIS — T466X5A Adverse effect of antihyperlipidemic and antiarteriosclerotic drugs, initial encounter: Secondary | ICD-10-CM

## 2024-03-05 DIAGNOSIS — E1165 Type 2 diabetes mellitus with hyperglycemia: Secondary | ICD-10-CM

## 2024-03-05 MED ORDER — METOPROLOL TARTRATE 25 MG PO TABS: 25.0000 mg | ORAL_TABLET | Freq: Two times a day (BID) | ORAL | 0 refills | Status: DC

## 2024-03-05 MED ORDER — AMLODIPINE BESYLATE 10 MG PO TABS: 10.0000 mg | ORAL_TABLET | Freq: Every day | ORAL | 0 refills | Status: DC

## 2024-03-05 MED ORDER — LEVOCETIRIZINE DIHYDROCHLORIDE 5 MG PO TABS: 5.0000 mg | ORAL_TABLET | Freq: Every evening | ORAL | 1 refills | Status: AC

## 2024-03-05 NOTE — Patient Instructions (Signed)

## 2024-03-05 NOTE — Progress Notes (Signed)
 Subjective:    Patient ID: Jimmy Tyler, male    DOB: Mar 19, 1976, 48 y.o.   MRN: [id number]  Chief Complaint  Patient presents with   Medical Management of Chronic Issues   Pt presents to the office today for chronic follow up.    Has hx of MI. Is not followed by Cardiologists.    Pt states he can not tolerate statins because of myalgia.     Has OSA and uses CPAP nightly.    He is morbid obese with BMI of 37 with DM and HTN.    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   He is taking Mounjaro  15 mg. His starting weight was 290 lb. He has lost 29 lbs.      03/05/2024    3:08 PM 11/26/2023    3:31 PM 08/15/2023   10:40 AM  Last 3 Weights  Weight (lbs) 261 lb 3.2 oz 276 lb 8 oz 269 lb  Weight (kg) 118.48 kg 125.42 kg 122.018 kg    Hypertension This is a chronic problem. The current episode started more than 1 year ago. The problem has been resolved since onset. The problem is controlled. Associated symptoms include malaise/fatigue. Pertinent negatives include no blurred vision, peripheral edema or shortness of breath. Risk factors for coronary artery disease include dyslipidemia, diabetes mellitus, obesity, male gender and sedentary lifestyle. The current treatment provides moderate improvement.  Diabetes He presents for his follow-up diabetic visit. He has type 2 diabetes mellitus. Associated symptoms include fatigue. Pertinent negatives for diabetes include no blurred vision and no foot paresthesias. Symptoms are stable. Pertinent negatives for diabetic complications include no peripheral neuropathy. Risk factors for coronary artery disease include dyslipidemia, diabetes mellitus, hypertension, sedentary lifestyle, obesity and male sex. He is following a generally healthy diet. (Does check glucose at home)  Gastroesophageal Reflux He complains of belching and heartburn. This is a chronic problem. The current episode started more than 1 year ago.  The problem occurs frequently. The symptoms are aggravated by certain foods. Associated symptoms include fatigue. Risk factors include obesity. He has tried a PPI and an antacid for the symptoms. The treatment provided moderate relief.  Hyperlipidemia This is a chronic problem. The current episode started more than 1 year ago. The problem is controlled. Exacerbating diseases include obesity. Pertinent negatives include no shortness of breath. Current antihyperlipidemic treatment includes ezetimibe . The current treatment provides moderate improvement of lipids. Risk factors for coronary artery disease include dyslipidemia, diabetes mellitus, hypertension, male sex, a sedentary lifestyle and obesity.  Depression        This is a recurrent problem.  The current episode started more than 1 year ago.   Associated symptoms include fatigue, irritable, decreased interest and sad.  Associated symptoms include no helplessness, no hopelessness and no restlessness.  Treatments tried: wellbutrin .     Review of Systems  Constitutional:  Positive for fatigue and malaise/fatigue.  Eyes:  Negative for blurred vision.  Respiratory:  Negative for shortness of breath.   Gastrointestinal:  Positive for heartburn.  All other systems reviewed and are negative.  Family History  Problem Relation Age of Onset   COPD Mother    Autism Son    Skin cancer Maternal Grandmother    Heart attack Maternal Grandfather    Social History   Socioeconomic History   Marital status: Divorced    Spouse name: Not on file   Number of children: Not on file  Years of education: Not on file   Highest education level: Associate degree: occupational, Scientist, product/process development, or vocational program  Occupational History   Not on file  Tobacco Use   Smoking status: Former    Current packs/day: 0.00    Types: Cigarettes    Quit date: 02/04/2014    Years since quitting: 10.0   Smokeless tobacco: Never  Vaping Use   Vaping status: Never Used   Substance and Sexual Activity   Alcohol use: Yes    Comment: very rarely    Drug use: Never   Sexual activity: Yes    Birth control/protection: None  Other Topics Concern   Not on file  Social History Narrative   Not on file   Social Drivers of Health   Financial Resource Strain: Not on file  Food Insecurity: No Food Insecurity (08/15/2023)   Hunger Vital Sign    Worried About Running Out of Food in the Last Year: Never true    Ran Out of Food in the Last Year: Never true  Transportation Needs: No Transportation Needs (03/05/2024)   PRAPARE - Administrator, Civil Service (Medical): No    Lack of Transportation (Non-Medical): No  Physical Activity: Inactive (03/05/2024)   Exercise Vital Sign    Days of Exercise per Week: 0 days    Minutes of Exercise per Session: Not on file  Stress: No Stress Concern Present (08/15/2023)   [employer] of Occupational Health - Occupational Stress Questionnaire    Feeling of Stress : Not at all  Social Connections: Unknown (03/05/2024)   Social Connection and Isolation Panel    Frequency of Communication with Friends and Family: Not on file    Frequency of Social Gatherings with Friends and Family: Not on file    Attends Religious Services: Not on file    Active Member of Clubs or Organizations: No    Attends Banker Meetings: Not on file    Marital Status: Divorced       Objective:   Physical Exam Vitals reviewed.  Constitutional:      General: He is irritable. He is not in acute distress.    Appearance: He is well-developed. He is obese.  HENT:     Head: Normocephalic.     Right Ear: Tympanic membrane normal.     Left Ear: Tympanic membrane normal.  Eyes:     General:        Right eye: No discharge.        Left eye: No discharge.     Pupils: Pupils are equal, round, and reactive to light.  Neck:     Thyroid : No thyromegaly.  Cardiovascular:     Rate and Rhythm: Normal rate and regular rhythm.      Heart sounds: Normal heart sounds. No murmur heard. Pulmonary:     Effort: Pulmonary effort is normal. No respiratory distress.     Breath sounds: Normal breath sounds. No wheezing.  Abdominal:     General: Bowel sounds are normal. There is no distension.     Palpations: Abdomen is soft.     Tenderness: There is no abdominal tenderness.  Musculoskeletal:        General: No tenderness. Normal range of motion.     Cervical back: Normal range of motion and neck supple.     Right lower leg: Edema (trace) present.     Left lower leg: Edema (trace) present.  Skin:    General: Skin is warm and  dry.     Findings: No erythema or rash.  Neurological:     Mental Status: He is alert and oriented to person, place, and time.     Cranial Nerves: No cranial nerve deficit.     Deep Tendon Reflexes: Reflexes are normal and symmetric.  Psychiatric:        Behavior: Behavior normal.        Thought Content: Thought content normal.        Judgment: Judgment normal.      BP 109/62   Pulse 63   Temp 97.9 F (36.6 C) (Temporal)   Ht 5' 10 (1.778 m)   Wt 261 lb 3.2 oz (118.5 kg)   BMI 37.48 kg/m       Assessment & Plan:  Jimmy Tyler comes in today with chief complaint of Medical Management of Chronic Issues   Diagnosis and orders addressed:  1. Hypertension associated with type 2 diabetes mellitus (HCC) - amLODipine  (NORVASC ) 10 MG tablet; Take 1 tablet (10 mg total) by mouth daily.  Dispense: 90 tablet; Refill: 0 - metoprolol  tartrate (LOPRESSOR ) 25 MG tablet; Take 1 tablet (25 mg total) by mouth 2 (two) times daily.  Dispense: 180 tablet; Refill: 0 - CMP14+EGFR  2. Coronary vasospasm - amLODipine  (NORVASC ) 10 MG tablet; Take 1 tablet (10 mg total) by mouth daily.  Dispense: 90 tablet; Refill: 0 - CMP14+EGFR  3. Seasonal allergies  - levocetirizine (XYZAL ) 5 MG tablet; Take 1 tablet (5 mg total) by mouth every evening.  Dispense: 90 tablet; Refill: 1 - CMP14+EGFR  4.  Depression, major, single episode, mild (Primary) - CMP14+EGFR  5. GAD (generalized anxiety disorder) - CMP14+EGFR  6. Gastroesophageal reflux disease without esophagitis - CMP14+EGFR  7. Mixed hyperlipidemia - CMP14+EGFR  8. Morbid obesity (HCC) - CMP14+EGFR  9. OSA (obstructive sleep apnea) - CMP14+EGFR  10. Type 2 diabetes mellitus with hyperglycemia, without long-term current use of insulin  (HCC) - CMP14+EGFR  11. Vitamin D  deficiency - CMP14+EGFR  12. Statin myopathy - CMP14+EGFR   Labs pending Will continue Mounjaro  15 mg  Stress management  Continue current medication  Health Maintenance reviewed Diet and exercise encouraged  Follow up plan: 3 months    Bari Learn, FNP

## 2024-03-06 ENCOUNTER — Ambulatory Visit: Payer: Self-pay | Admitting: Family

## 2024-03-06 LAB — CMP14+EGFR
ALT: 36 IU/L (ref 0–44)
AST: 30 IU/L (ref 0–40)
Albumin: 4.6 g/dL (ref 4.1–5.1)
Alkaline Phosphatase: 62 IU/L (ref 47–123)
BUN/Creatinine Ratio: 18 (ref 9–20)
BUN: 21 mg/dL (ref 6–24)
Bilirubin Total: 0.5 mg/dL (ref 0.0–1.2)
CO2: 24 mmol/L (ref 20–29)
Calcium: 9.6 mg/dL (ref 8.7–10.2)
Chloride: 99 mmol/L (ref 96–106)
Creatinine, Ser: 1.18 mg/dL (ref 0.76–1.27)
Globulin, Total: 2.8 g/dL (ref 1.5–4.5)
Glucose: 90 mg/dL (ref 70–99)
Potassium: 4.9 mmol/L (ref 3.5–5.2)
Sodium: 136 mmol/L (ref 134–144)
Total Protein: 7.4 g/dL (ref 6.0–8.5)
eGFR: 75 mL/min/1.73 (ref >59)

## 2024-04-14 ENCOUNTER — Other Ambulatory Visit: Payer: Self-pay | Admitting: Family

## 2024-04-14 NOTE — Telephone Encounter (Signed)
 Appt. 06-09-2024 with Jimmy Tyler

## 2024-04-14 NOTE — Telephone Encounter (Signed)
 Christy NTBS in Jan for 3 mos FU RF sent to pharmacy

## 2024-06-03 ENCOUNTER — Other Ambulatory Visit: Payer: Self-pay | Admitting: Family

## 2024-06-03 DIAGNOSIS — E1165 Type 2 diabetes mellitus with hyperglycemia: Secondary | ICD-10-CM

## 2024-06-03 DIAGNOSIS — E782 Mixed hyperlipidemia: Secondary | ICD-10-CM

## 2024-06-03 DIAGNOSIS — I152 Hypertension secondary to endocrine disorders: Secondary | ICD-10-CM

## 2024-06-09 ENCOUNTER — Ambulatory Visit: Payer: Self-pay | Admitting: Family

## 2024-06-23 ENCOUNTER — Other Ambulatory Visit: Payer: Self-pay | Admitting: Family

## 2024-06-23 DIAGNOSIS — I201 Angina pectoris with documented spasm: Secondary | ICD-10-CM

## 2024-06-23 DIAGNOSIS — I152 Hypertension secondary to endocrine disorders: Secondary | ICD-10-CM

## 2024-06-30 ENCOUNTER — Ambulatory Visit: Payer: Self-pay | Admitting: Family
